# Patient Record
Sex: Female | Born: 1985 | Race: White | Hispanic: No | Marital: Single | Smoking: Current every day smoker
Health system: Southern US, Community
[De-identification: ages and names within clinical notes are randomized; demographics above are authoritative.]

---

## 2019-08-09 ENCOUNTER — Encounter: Payer: Self-pay | Admitting: Emergency Medicine

## 2019-08-09 ENCOUNTER — Other Ambulatory Visit: Payer: Self-pay

## 2019-08-09 DIAGNOSIS — K0401 Reversible pulpitis: Secondary | ICD-10-CM | POA: Insufficient documentation

## 2019-08-09 DIAGNOSIS — F17200 Nicotine dependence, unspecified, uncomplicated: Secondary | ICD-10-CM | POA: Insufficient documentation

## 2019-08-09 NOTE — ED Triage Notes (Addendum)
Patient ambulatory to triage with steady gait, without difficulty or distress noted; pt reports 8yrs ago filling fell out left lower tooth, past 4 days having increased pain; taking ibuprofen without relief (last ds 54min PTA)

## 2019-08-10 ENCOUNTER — Emergency Department
Admission: EM | Admit: 2019-08-10 | Discharge: 2019-08-10 | Disposition: A | Discharge status: Home / Self Care | Payer: Self-pay | Attending: Emergency Medicine | Admitting: Emergency Medicine

## 2019-08-10 DIAGNOSIS — K0401 Reversible pulpitis: Secondary | ICD-10-CM

## 2019-08-10 MED ORDER — PENICILLIN V POTASSIUM 500 MG PO TABS: 500.0000 mg | ORAL_TABLET | Freq: Two times a day (BID) | ORAL | 0 refills | Status: AC

## 2019-08-10 MED ORDER — TRAMADOL HCL 50 MG PO TABS: 50.0000 mg | ORAL_TABLET | Freq: Four times a day (QID) | ORAL | 0 refills | Status: AC | PRN

## 2019-08-10 NOTE — ED Provider Notes (Signed)
[facility] Emergency Department Provider Note  ____________________________________________   First MD Initiated Contact with Patient 08/10/19 0150     (approximate)  I have reviewed the triage vital signs and the nursing notes.   HISTORY  Chief Complaint Dental pain.    HPI Dawn Ware is a 33 y.o. female who presents with left lower tooth pain.  Patient had a filling fall out four years ago.  Patient's been having increased pain over the past 4 days.  She been taking ibuprofen without relief.  Patient has pain on the molar tooth that is severe, constant, 4 days, not better with ibuprofen, worse with cold food.  Denies any facial swelling.  No fevers.          History reviewed. No pertinent past medical history.  There are no active problems to display for this patient.   History reviewed. No pertinent surgical history.  Prior to Admission medications   Not on File    Allergies Patient has no known allergies.  No family history on file.  Social History Social History   Tobacco Use  . Smoking status: Current Every Day Smoker  . Smokeless tobacco: Never Used  Substance Use Topics  . Alcohol use: Not on file  . Drug use: Not on file      Review of Systems Constitutional: No fever/chills Eyes: No visual changes. ENT: No sore throat.  Positive tooth pain Cardiovascular: Denies chest pain. Respiratory: Denies shortness of breath. Gastrointestinal: No abdominal pain.  No nausea, no vomiting.  No diarrhea.  No constipation. Genitourinary: Negative for dysuria. Musculoskeletal: Negative for back pain. Skin: Negative for rash. Neurological: Negative for headaches, focal weakness or numbness. All other ROS negative ____________________________________________   PHYSICAL EXAM:  VITAL SIGNS: ED Triage Vitals  Enc Vitals Group     BP 08/09/19 2335 (!) 151/92     Pulse Rate 08/09/19 2335 83     Resp 08/09/19 2335 16     Temp  08/09/19 2335 98.5 F (36.9 C)     Temp Source 08/09/19 2335 Oral     SpO2 08/09/19 2335 100 %     Weight 08/09/19 2335 173 lb (78.5 kg)     Height 08/09/19 2335 5\' 4"  (1.626 m)     Head Circumference --      Peak Flow --      Pain Score 08/09/19 2339 10     Pain Loc --      Pain Edu? --      Excl. in [place]? --     Constitutional: Alert and oriented. Well appearing and in no acute distress. Eyes: Conjunctivae are normal. EOMI. Head: Atraumatic. Nose: No congestion/rhinnorhea. Mouth/Throat: Mucous membranes are moist.  Patient has weight feeling on the back left molar tooth with some tenderness on the tooth.  No fluctuation.  No swelling on the outside of the face. Neck: No stridor. Trachea Midline. FROM Cardiovascular: Normal rate, regular rhythm. Grossly normal heart sounds.  Good peripheral circulation. Respiratory: Normal respiratory effort.  No retractions. Lungs CTAB. Gastrointestinal: Soft and nontender. No distention. No abdominal bruits.  Musculoskeletal: No lower extremity tenderness nor edema.  No joint effusions. Neurologic:  Normal speech and language. No gross focal neurologic deficits are appreciated.  Skin:  Skin is warm, dry and intact. No rash noted. Psychiatric: Mood and affect are normal. Speech and behavior are normal. GU: Deferred   _INITIAL IMPRESSION / [place] / ED COURSE  Dawn Ware was evaluated in  Emergency Department on 08/10/2019 for the symptoms described in the history of present illness. She was evaluated in the context of the global COVID-19 pandemic, which necessitated consideration that the patient might be at risk for infection with the SARS-CoV-2 virus that causes COVID-19. Institutional protocols and algorithms that pertain to the evaluation of patients at risk for COVID-19 are in a state of rapid change based on information released by regulatory bodies including the CDC and federal and state organizations. These policies and  algorithms were followed during the patient's care in the ED.    This is most consistent with pulpitis.  No evidence of abscess, trench mouth, retropharyngeal abscess.  Will give patient amoxicillin, recommended Tylenol and ibuprofen for pain and using tramadol for breakthrough pain I will give a list of dentist.  ____________________________________________   FINAL CLINICAL IMPRESSION(S) / ED DIAGNOSES   Final diagnoses:  Pulpitis      MEDICATIONS GIVEN DURING THIS VISIT:  Medications - No data to display   ED Discharge Orders         Ordered    penicillin v potassium (VEETID) 500 MG tablet  2 times daily     08/10/19 0212    traMADol (ULTRAM) 50 MG tablet  Every 6 hours PRN     08/10/19 0212           Note:  This document was prepared using Dragon voice recognition software and may include unintentional dictation errors.   Concha Se, MD 08/10/19 [phone]

## 2019-08-10 NOTE — Discharge Instructions (Addendum)
Take ibuprofen 60 3 times daily.  Tylenol 1 g 3 times daily.  Use tramadol for breakthrough pain.  Do not drive or work while on this.  Follow-up with dentist.  OPTIONS FOR DENTAL FOLLOW UP CARE  [place] [facility] Health and [facility] Services - Local Safety Net Dental Clinics [email or web address]   [facility] [phone])  [facility] [phone])  [place] [phone])  [facility] [phone])  [facility] [phone]) This clinic caters to the indigent population and is on a lottery system. Location: [employer] [facility], [employer], [address] [place] Clinic Hours: Wednesdays from 6pm - 9pm, patients seen by a lottery system. For dates, call or go to [email or web address] Services: Cleanings, fillings and simple extractions. Payment Options: DENTAL WORK IS FREE OF CHARGE. Bring proof of income or support. Best way to get seen: Arrive at 5:15 pm - this is a lottery, NOT first come/first serve, so arriving earlier will not increase your chances of being seen.     [facility] [phone] Select option 1 for emergencies   Location: [facility], [place], [address], [place] Clinic Hours: No walk-ins accepted - call the day before to schedule an appointment. Check in times are 9:30 am and 1:30 pm. Services: Simple extractions, temporary fillings, pulpectomy/pulp debridement, uncomplicated abscess drainage. Payment Options: PAYMENT IS DUE AT THE TIME OF SERVICE.  Fee is usually $100-200, additional surgical procedures (e.g. abscess drainage) may be extra. Cash, checks, Visa/MasterCard accepted.  Can file Medicaid if patient is covered for dental - patient should call case worker to check. No discount for [facility] patients. Best way to get seen: MUST call  the day before and get onto the schedule. Can usually be seen the next 1-2 days. No walk-ins accepted.     [facility] [phone]   Location: [facility], [address], [place] Clinic Hours: M, W, Th, F 8am or 1:30pm, Tues 9a or 1:30 - first come/first served. Services: Simple extractions, temporary fillings, uncomplicated abscess drainage.  You do not need to be an [facility] resident. Payment Options: PAYMENT IS DUE AT THE TIME OF SERVICE. Dental insurance, otherwise sliding scale - bring proof of income or support. Depending on income and treatment needed, cost is usually $50-200. Best way to get seen: Arrive early as it is first come/first served.     [facility] [phone]   Location: [address] Clinic Hours: Mon-Thu 8a-5p Services: Most basic dental services including extractions and fillings. Payment Options: PAYMENT IS DUE AT THE TIME OF SERVICE. Sliding scale, up to 50% off - bring proof if income or support. Medicaid with dental option accepted. Best way to get seen: Call to schedule an appointment, can usually be seen within 2 weeks OR they will try to see walk-ins - show up at 8a or 2p (you may have to wait).     [facility] [phone] [place] RESIDENTS ONLY   Location: [facility], [address] [place], [place] [address] Clinic Hours: By appointment only. Monday - Thursday 8am-5pm, Friday 8am-12pm Services: Cleanings, fillings, extractions. Payment Options: PAYMENT IS DUE AT THE TIME OF SERVICE. Cash, Visa or MasterCard. Sliding scale - $30 minimum per service. Best way to get seen: Come in to office, complete packet and make an appointment - need proof of income or support monies for  each household member and proof of [facility] residence. Usually takes about a month to get in.     [facility] [phone]   Location: [address], [place] Clinic Hours: Walk-in Urgent Care Dental Services are offered Monday-Friday mornings only. The numbers of emergencies accepted daily is limited to the number of providers available. Maximum 15 - Mondays, Wednesdays & Thursdays Maximum 10 - Tuesdays & Fridays Services: You do not need to be a [facility] resident to be seen for a dental emergency. Emergencies are defined as pain, swelling, abnormal bleeding, or dental trauma. Walkins will receive x-rays if needed. NOTE: Dental cleaning is not an emergency. Payment Options: PAYMENT IS DUE AT THE TIME OF SERVICE. Minimum co-pay is $40.00 for uninsured patients. Minimum co-pay is $3.00 for Medicaid with dental coverage. Dental Insurance is accepted and must be presented at time of visit. Medicare does not cover dental. Forms of payment: Cash, credit card, checks. Best way to get seen: If not previously registered with the clinic, walk-in dental registration begins at 7:15 am and is on a first come/first serve basis. If previously registered with the clinic, call to make an appointment.     [facility] [place] ONLY   Location: [address], [place], [place] Clinic Hours: Mon-Thu 10a-2p Services: Extractions only! Payment Options: FREE (donations accepted) - bring proof of income or support Best way to get seen: Call and schedule an appointment OR come at 8am on the 1st Monday of every month (except for holidays) when it is first come/first served.     [facility] [phone]   Location: [place], [place] Clinic Hours: Friday mornings Services, Payment Options, Best way to get seen: Call for info

## 2020-06-25 ENCOUNTER — Emergency Department
Admission: EM | Admit: 2020-06-25 | Discharge: 2020-06-25 | Disposition: A | Discharge status: Home / Self Care | Payer: HRSA Program | Attending: Emergency Medicine | Admitting: Emergency Medicine

## 2020-06-25 ENCOUNTER — Emergency Department: Payer: HRSA Program

## 2020-06-25 ENCOUNTER — Other Ambulatory Visit: Payer: Self-pay

## 2020-06-25 DIAGNOSIS — R079 Chest pain, unspecified: Secondary | ICD-10-CM | POA: Diagnosis present

## 2020-06-25 DIAGNOSIS — U071 COVID-19: Secondary | ICD-10-CM | POA: Diagnosis not present

## 2020-06-25 DIAGNOSIS — F17200 Nicotine dependence, unspecified, uncomplicated: Secondary | ICD-10-CM | POA: Diagnosis not present

## 2020-06-25 DIAGNOSIS — R0789 Other chest pain: Secondary | ICD-10-CM

## 2020-06-25 DIAGNOSIS — R0602 Shortness of breath: Secondary | ICD-10-CM

## 2020-06-25 DIAGNOSIS — R059 Cough, unspecified: Secondary | ICD-10-CM

## 2020-06-25 LAB — COMPREHENSIVE METABOLIC PANEL
ALT: 55 U/L — ABNORMAL HIGH (ref 0–44)
AST: 63 U/L — ABNORMAL HIGH (ref 15–41)
Albumin: 4.3 g/dL (ref 3.5–5.0)
Alkaline Phosphatase: 47 U/L (ref 38–126)
Anion gap: 6 (ref 5–15)
BUN: 8 mg/dL (ref 6–20)
CO2: 29 mmol/L (ref 22–32)
Calcium: 8.7 mg/dL — ABNORMAL LOW (ref 8.9–10.3)
Chloride: 102 mmol/L (ref 98–111)
Creatinine, Ser: 0.64 mg/dL (ref 0.44–1.00)
GFR calc Af Amer: >60 mL/min (ref >60)
GFR calc non Af Amer: >60 mL/min (ref >60)
Glucose, Bld: 93 mg/dL (ref 70–99)
Potassium: 3.6 mmol/L (ref 3.5–5.1)
Sodium: 137 mmol/L (ref 135–145)
Total Bilirubin: 0.4 mg/dL (ref 0.3–1.2)
Total Protein: 7.8 g/dL (ref 6.5–8.1)

## 2020-06-25 LAB — TROPONIN I (HIGH SENSITIVITY)
Troponin I (High Sensitivity): 5 ng/L (ref <18)
Troponin I (High Sensitivity): 4 ng/L (ref <18)

## 2020-06-25 LAB — CBC
HCT: 39.7 % (ref 36.0–46.0)
Hemoglobin: 13.1 g/dL (ref 12.0–15.0)
MCH: 30.7 pg (ref 26.0–34.0)
MCHC: 33 g/dL (ref 30.0–36.0)
MCV: 93 fL (ref 80.0–100.0)
Platelets: 102 10*3/uL — ABNORMAL LOW (ref 150–400)
RBC: 4.27 MIL/uL (ref 3.87–5.11)
RDW: 12.6 % (ref 11.5–15.5)
WBC: 4.5 10*3/uL (ref 4.0–10.5)
nRBC: 0 % (ref 0.0–0.2)

## 2020-06-25 LAB — LACTIC ACID, PLASMA: Lactic Acid, Venous: 0.8 mmol/L (ref 0.5–1.9)

## 2020-06-25 MED ORDER — ACETAMINOPHEN 500 MG PO TABS
1000.0000 mg | ORAL_TABLET | Freq: Once | ORAL | Status: AC
  Administered 2020-06-25: 1000 mg via ORAL
  Filled 2020-06-25: qty 2

## 2020-06-25 MED ORDER — KETOROLAC TROMETHAMINE 30 MG/ML IJ SOLN
30.0000 mg | Freq: Once | INTRAMUSCULAR | Status: AC
  Administered 2020-06-25: 30 mg via INTRAMUSCULAR
  Filled 2020-06-25: qty 1

## 2020-06-25 NOTE — Discharge Instructions (Addendum)
Please take Tylenol and ibuprofen/Advil for your pain.  It is safe to take them together, or to alternate them every few hours.  Take up to 1000mg  of Tylenol at a time, up to 4 times per day.  Do not take more than 4000 mg of Tylenol in 24 hours.  For ibuprofen, take 400-600 mg, 4-5 times per day.  You may continue to use Mucinex as well in addition to the above medications.  There is no evidence of strain or damage to your heart.  No evidence of a bacterial pneumonia or illness that requires antibiotics.  If you develop any worsening symptoms despite the above medications, please return to the ED.

## 2020-06-25 NOTE — ED Provider Notes (Signed)
[facility] Emergency Department Provider Note ____________________________________________   None    (approximate)  I have reviewed the triage vital signs and the nursing notes.  HISTORY  Chief Complaint Chest Pain (Thinks she has pneumonia)   HPI Dawn Ware is a 34 y.o. femalewho presents to the ED for evaluation of chest pain.   Chart review indicates no relevant medical history within her system. Patient is a non-smoker with no cardiac history.  No family history of early cardiac disease.  No history of diabetes.  Patient reports having symptoms for 6 days and testing positive for Covid 6 days ago.  She reports fevers and chills, transiently resolved with oral ibuprofen.  Shortness of breath, nonproductive cough and substernal chest pain.  She reports her chest pain also improved with ibuprofen ministration.  It is up to 6/10 intensity, aching in nature, substernal and constant when it is present.  He reports waking up in the middle the night last night and feeling short of breath, "getting into a coughing fit" and developing fear that there was something wrong with her heart.  She therefore presents to the ED for evaluation of her chest pain.  History reviewed. No pertinent past medical history.  There are no problems to display for this patient.   History reviewed. No pertinent surgical history.  Prior to Admission medications   Not on File    Allergies Patient has no known allergies.  No family history on file.  Social History Social History   Tobacco Use  . Smoking status: Current Every Day Smoker  . Smokeless tobacco: Never Used  Vaping Use  . Vaping Use: Every day  Substance Use Topics  . Alcohol use: Not Currently  . Drug use: Not Currently    Review of Systems  Constitutional: Positive for fevers and chills. Eyes: No visual changes. ENT: No sore throat. Cardiovascular: Positive chest pain Respiratory: Positive  shortness of breath and nonproductive cough. Gastrointestinal: No abdominal pain.  No nausea, no vomiting.  No diarrhea.  No constipation. Genitourinary: Negative for dysuria. Musculoskeletal: Negative for back pain. Skin: Negative for rash. Neurological: Negative for headaches, focal weakness or numbness.   ____________________________________________   PHYSICAL EXAM:  VITAL SIGNS: Vitals:   06/25/20 0644 06/25/20 0913  BP: 126/77 135/82  Pulse: 89 99  Resp: 18 20  Temp: 99.2 F (37.3 C) 98.8 F (37.1 C)  SpO2: 98% 98%      Constitutional: Alert and oriented.  Uncomfortable-appearing and in no acute distress. Eyes: Conjunctivae are normal. PERRL. EOMI. Head: Atraumatic. Nose: No congestion/rhinnorhea. Mouth/Throat: Mucous membranes are moist.  Oropharynx non-erythematous. Neck: No stridor. No cervical spine tenderness to palpation. Cardiovascular: Normal rate, regular rhythm. Grossly normal heart sounds.  Good peripheral circulation. Respiratory: Normal respiratory effort.  No retractions. Lungs CTAB. Gastrointestinal: Soft , nondistended, nontender to palpation. No abdominal bruits. No CVA tenderness. Musculoskeletal: No lower extremity tenderness nor edema.  No joint effusions. No signs of acute trauma. Neurologic:  Normal speech and language. No gross focal neurologic deficits are appreciated. No gait instability noted. Skin:  Skin is warm, dry and intact. No rash noted. Psychiatric: Mood and affect are normal. Speech and behavior are normal.  ____________________________________________   LABS (all labs ordered are listed, but only abnormal results are displayed)  Labs Reviewed  CBC - Abnormal; Notable for the following components:      Result Value   Platelets 102 (*)    All other components within normal limits  COMPREHENSIVE  METABOLIC PANEL - Abnormal; Notable for the following components:   Calcium 8.7 (*)    AST 63 (*)    ALT 55 (*)    All other  components within normal limits  LACTIC ACID, PLASMA  LACTIC ACID, PLASMA  TROPONIN I (HIGH SENSITIVITY)  TROPONIN I (HIGH SENSITIVITY)   ____________________________________________  12 Lead EKG  Sinus rhythm, rate of 84 bpm, normal axis and intervals.  No evidence of acute ischemia. ____________________________________________  RADIOLOGY  ED MD interpretation: Two view CXR with evidence of bilateral patchy infiltrates consistent with no diagnosis of COVID-19, without evidence of discrete lobar patrician to suggest a superimposed bacterial pneumonia.  Official radiology report(s): DG Chest 2 View  Result Date: 06/25/2020 CLINICAL DATA:  COVID positive EXAM: CHEST - 2 VIEW COMPARISON:  None. FINDINGS: Patchy bilateral airspace disease, mild to moderate in extent. No edema, effusion, or pneumothorax. Normal heart size and mediastinal contours. IMPRESSION: Bilateral atypical pneumonia. Electronically Signed   By: Marnee Spring M.D.   On: 06/25/2020 05:16    ____________________________________________   PROCEDURES and INTERVENTIONS  Procedure(s) performed (including Critical Care):  Procedures  Medications  acetaminophen (TYLENOL) tablet 1,000 mg (1,000 mg Oral Given 06/25/20 1047)  ketorolac (TORADOL) 30 MG/ML injection 30 mg (30 mg Intramuscular Given 06/25/20 1048)    ____________________________________________   MDM / ED COURSE  Otherwise healthy and low risk 34 year old female presenting with chest pain and shortness of breath in the setting of known COVID-19 positivity, without evidence of cardiac ischemia or additional acute pathologies, amenable to outpatient management.  Patient febrile in triage, otherwise normal vital signs on room air.  Exam with an uncomfortable-appearing patient, who has no distress, signs of trauma or neurovascular deficits.  Her abdomen is benign and after Toradol/Tylenol ministration, she looks quite well.  She is not hypoxic with ambulation  trial, per RN documentation.  CXR demonstrates expected opacities in the setting of known COVID-19 diagnosis.  Nonischemic EKG and negative troponin, blood work otherwise without acute derangements.  No evidence of acute superimposed bacterial pneumonia to require antibiotics.  Educated patient on outpatient management of Covid, and we discussed return precautions for the ED.  Patient medically stable for discharge home.  ____________________________________________   FINAL CLINICAL IMPRESSION(S) / ED DIAGNOSES  Final diagnoses:  Other chest pain  COVID-19  Shortness of breath  Cough     ED Discharge Orders    None       Dawn Ware   Note:  This document was prepared using Dragon voice recognition software and may include unintentional dictation errors.   Delton [place], MD 06/25/20 1130

## 2020-06-25 NOTE — ED Triage Notes (Signed)
Chest pain with inspiration. Takes a deep breath and starts to cough. Keeps coughing. Has Covid x 6 days. Here because she feels like she is "about to die."

## 2020-06-25 NOTE — ED Notes (Signed)
Pulse ox walking showed 93-94%, md aware

## 2020-06-25 NOTE — ED Triage Notes (Signed)
Patient has taken ibuprofen for fever at home at 0230.

## 2020-06-29 ENCOUNTER — Inpatient Hospital Stay: Payer: HRSA Program

## 2020-06-29 ENCOUNTER — Encounter: Payer: Self-pay | Admitting: Internal Medicine

## 2020-06-29 ENCOUNTER — Inpatient Hospital Stay
Admission: EM | Admit: 2020-06-29 | Discharge: 2020-07-04 | DRG: 177 | Disposition: A | Discharge status: Home / Self Care | Payer: HRSA Program | Source: Ambulatory Visit | Attending: Internal Medicine | Admitting: Internal Medicine

## 2020-06-29 ENCOUNTER — Other Ambulatory Visit: Payer: Self-pay

## 2020-06-29 ENCOUNTER — Ambulatory Visit
Admission: EM | Admit: 2020-06-29 | Discharge: 2020-06-29 | Disposition: A | Discharge status: Home / Self Care | Payer: Medicaid Other | Attending: Family Medicine | Admitting: Family Medicine

## 2020-06-29 ENCOUNTER — Emergency Department: Payer: HRSA Program

## 2020-06-29 ENCOUNTER — Encounter: Payer: Self-pay | Admitting: Emergency Medicine

## 2020-06-29 DIAGNOSIS — R05 Cough: Secondary | ICD-10-CM

## 2020-06-29 DIAGNOSIS — U071 COVID-19: Secondary | ICD-10-CM

## 2020-06-29 DIAGNOSIS — F17290 Nicotine dependence, other tobacco product, uncomplicated: Secondary | ICD-10-CM | POA: Diagnosis present

## 2020-06-29 DIAGNOSIS — Z8249 Family history of ischemic heart disease and other diseases of the circulatory system: Secondary | ICD-10-CM

## 2020-06-29 DIAGNOSIS — R7401 Elevation of levels of liver transaminase levels: Secondary | ICD-10-CM | POA: Diagnosis present

## 2020-06-29 DIAGNOSIS — J1282 Pneumonia due to coronavirus disease 2019: Secondary | ICD-10-CM | POA: Diagnosis present

## 2020-06-29 DIAGNOSIS — R0603 Acute respiratory distress: Secondary | ICD-10-CM | POA: Diagnosis not present

## 2020-06-29 DIAGNOSIS — R5383 Other fatigue: Secondary | ICD-10-CM

## 2020-06-29 DIAGNOSIS — R0902 Hypoxemia: Secondary | ICD-10-CM | POA: Diagnosis not present

## 2020-06-29 DIAGNOSIS — R0602 Shortness of breath: Secondary | ICD-10-CM | POA: Diagnosis not present

## 2020-06-29 DIAGNOSIS — J9600 Acute respiratory failure, unspecified whether with hypoxia or hypercapnia: Secondary | ICD-10-CM | POA: Diagnosis not present

## 2020-06-29 DIAGNOSIS — J9601 Acute respiratory failure with hypoxia: Secondary | ICD-10-CM | POA: Diagnosis present

## 2020-06-29 DIAGNOSIS — J9621 Acute and chronic respiratory failure with hypoxia: Secondary | ICD-10-CM | POA: Diagnosis present

## 2020-06-29 DIAGNOSIS — R531 Weakness: Secondary | ICD-10-CM

## 2020-06-29 DIAGNOSIS — R079 Chest pain, unspecified: Secondary | ICD-10-CM

## 2020-06-29 DIAGNOSIS — R519 Headache, unspecified: Secondary | ICD-10-CM

## 2020-06-29 DIAGNOSIS — R059 Cough, unspecified: Secondary | ICD-10-CM

## 2020-06-29 LAB — CBC WITH DIFFERENTIAL/PLATELET
Abs Immature Granulocytes: 0.04 10*3/uL (ref 0.00–0.07)
Basophils Absolute: 0 10*3/uL (ref 0.0–0.1)
Basophils Relative: 0 %
Eosinophils Absolute: 0 10*3/uL (ref 0.0–0.5)
Eosinophils Relative: 0 %
HCT: 41.2 % (ref 36.0–46.0)
Hemoglobin: 13.8 g/dL (ref 12.0–15.0)
Immature Granulocytes: 1 %
Lymphocytes Relative: 16 %
Lymphs Abs: 0.9 10*3/uL (ref 0.7–4.0)
MCH: 30.5 pg (ref 26.0–34.0)
MCHC: 33.5 g/dL (ref 30.0–36.0)
MCV: 90.9 fL (ref 80.0–100.0)
Monocytes Absolute: 0.6 10*3/uL (ref 0.1–1.0)
Monocytes Relative: 11 %
Neutro Abs: 4.2 10*3/uL (ref 1.7–7.7)
Neutrophils Relative %: 72 %
Platelets: 158 10*3/uL (ref 150–400)
RBC: 4.53 MIL/uL (ref 3.87–5.11)
RDW: 12.5 % (ref 11.5–15.5)
WBC: 5.8 10*3/uL (ref 4.0–10.5)
nRBC: 0 % (ref 0.0–0.2)

## 2020-06-29 LAB — COMPREHENSIVE METABOLIC PANEL
ALT: 102 U/L — ABNORMAL HIGH (ref 0–44)
AST: 140 U/L — ABNORMAL HIGH (ref 15–41)
Albumin: 3.8 g/dL (ref 3.5–5.0)
Alkaline Phosphatase: 45 U/L (ref 38–126)
Anion gap: 12 (ref 5–15)
BUN: 7 mg/dL (ref 6–20)
CO2: 25 mmol/L (ref 22–32)
Calcium: 8.6 mg/dL — ABNORMAL LOW (ref 8.9–10.3)
Chloride: 100 mmol/L (ref 98–111)
Creatinine, Ser: 0.7 mg/dL (ref 0.44–1.00)
GFR calc Af Amer: >60 mL/min (ref >60)
GFR calc non Af Amer: >60 mL/min (ref >60)
Glucose, Bld: 102 mg/dL — ABNORMAL HIGH (ref 70–99)
Potassium: 4 mmol/L (ref 3.5–5.1)
Sodium: 137 mmol/L (ref 135–145)
Total Bilirubin: 1 mg/dL (ref 0.3–1.2)
Total Protein: 7.3 g/dL (ref 6.5–8.1)

## 2020-06-29 LAB — BLOOD GAS, VENOUS
Acid-Base Excess: 7.6 mmol/L — ABNORMAL HIGH (ref 0.0–2.0)
Bicarbonate: 32.7 mmol/L — ABNORMAL HIGH (ref 20.0–28.0)
O2 Saturation: 17.3 %
Patient temperature: 37
pCO2, Ven: 46 mmHg (ref 44.0–60.0)
pH, Ven: 7.46 — ABNORMAL HIGH (ref 7.250–7.430)

## 2020-06-29 LAB — TROPONIN I (HIGH SENSITIVITY)
Troponin I (High Sensitivity): 5 ng/L (ref <18)
Troponin I (High Sensitivity): 7 ng/L (ref <18)

## 2020-06-29 LAB — POCT PREGNANCY, URINE: Preg Test, Ur: NEGATIVE

## 2020-06-29 LAB — HIV ANTIBODY (ROUTINE TESTING W REFLEX): HIV Screen 4th Generation wRfx: NONREACTIVE

## 2020-06-29 LAB — BRAIN NATRIURETIC PEPTIDE: B Natriuretic Peptide: 30.7 pg/mL (ref 0.0–100.0)

## 2020-06-29 LAB — ABO/RH: ABO/RH(D): A POS

## 2020-06-29 LAB — MAGNESIUM: Magnesium: 2 mg/dL (ref 1.7–2.4)

## 2020-06-29 LAB — FIBRIN DERIVATIVES D-DIMER (ARMC ONLY): Fibrin derivatives D-dimer (ARMC): 982.42 ng/mL (FEU) — ABNORMAL HIGH (ref 0.00–499.00)

## 2020-06-29 IMAGING — CT CT ANGIO CHEST
2 of 6 series · 19 of 46 positions shown · IV contrast (APPLIED)
Comparison: None.

CLINICAL DATA: Chest pain, COVID positive

EXAM:
CT ANGIOGRAPHY CHEST WITH CONTRAST
TECHNIQUE: Multidetector CT imaging of the chest was performed using the
standard protocol during bolus administration of intravenous
contrast. Multiplanar CT image reconstructions and MIPs were
obtained to evaluate the vascular anatomy.
CONTRAST:  100mL OMNIPAQUE IOHEXOL 350 MG/ML SOLN

[Series 5: thins · axial · 0.64mm/px · z∈[+558,+800]mm · 17 of 267 slices shown]
[im 12/267  lung]
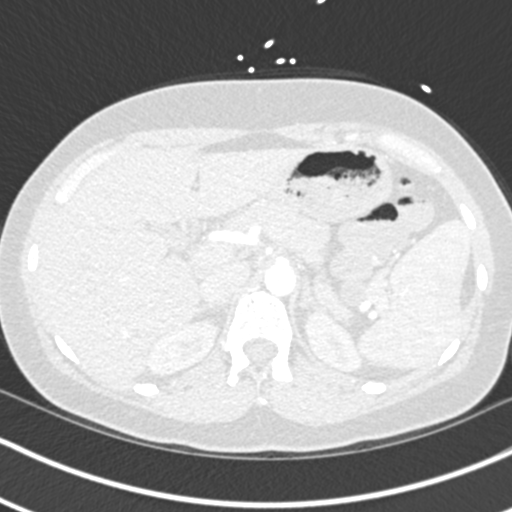
[im 24/267  soft-tissue]
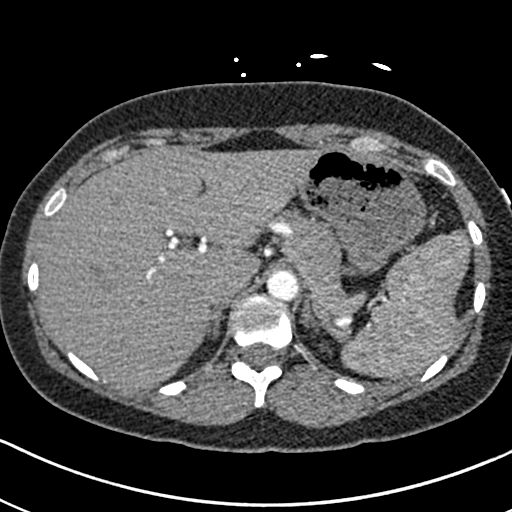
[im 47/267  lung]
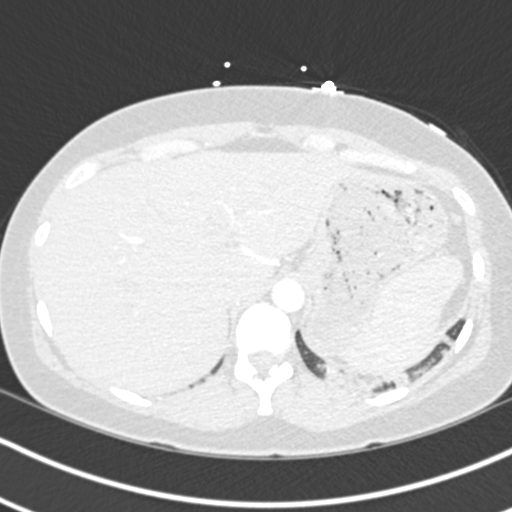
[im 58/267  soft-tissue]
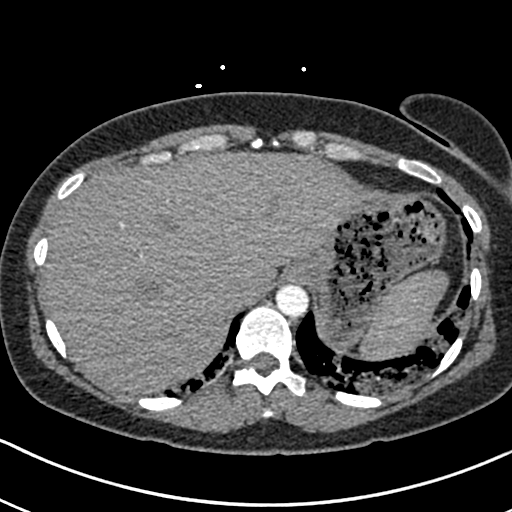
[im 70/267  lung]
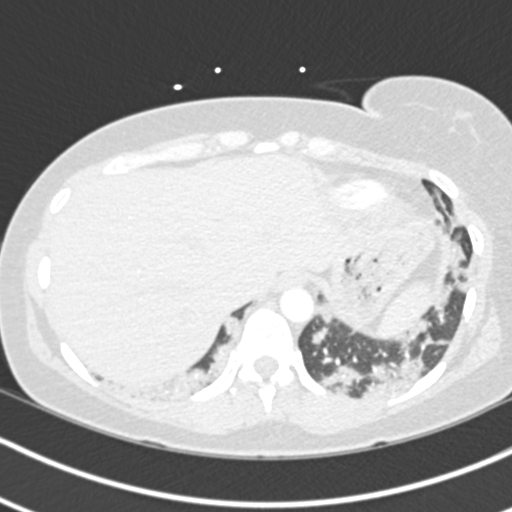
[im 93/267  soft-tissue]
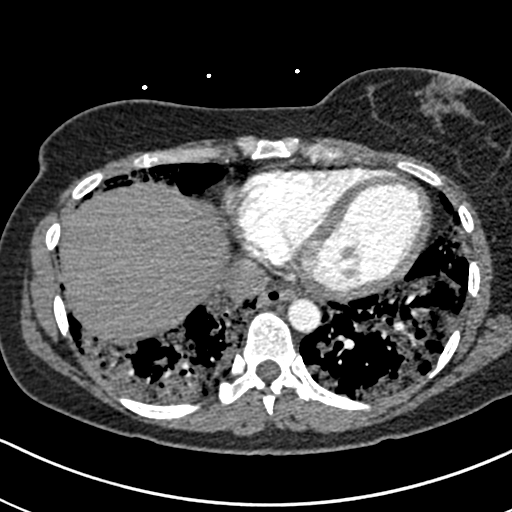
[im 105/267  lung]
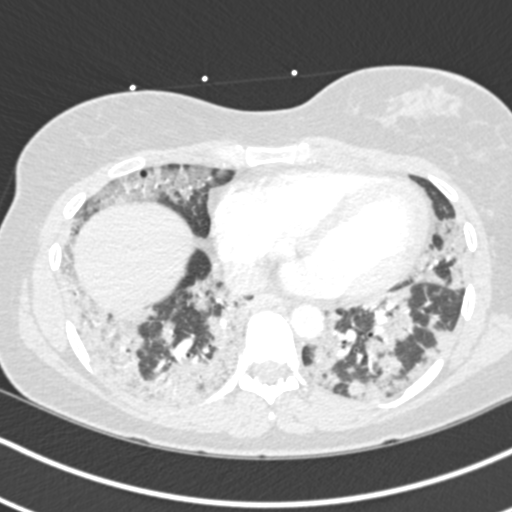
[im 116/267  soft-tissue]
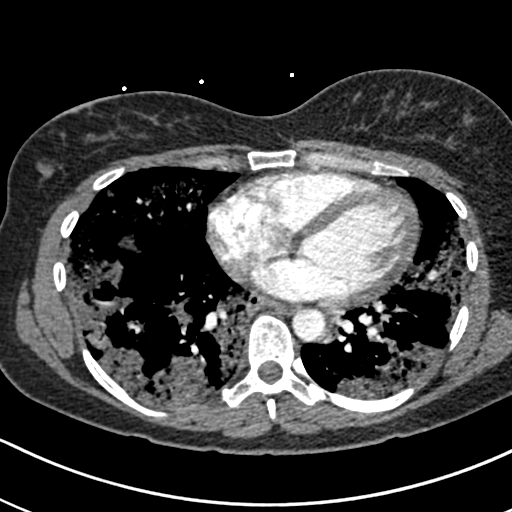
[im 139/267  lung]
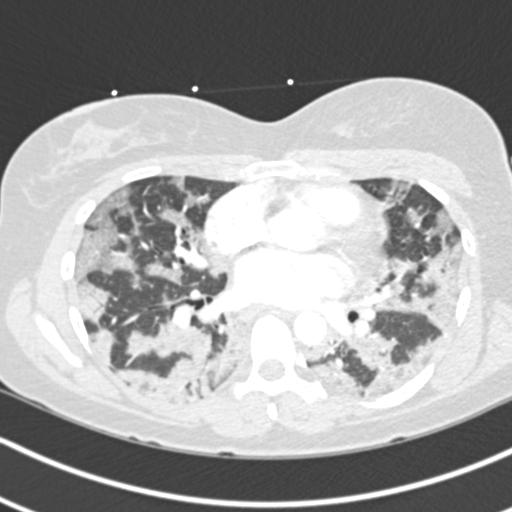
[im 151/267  soft-tissue]
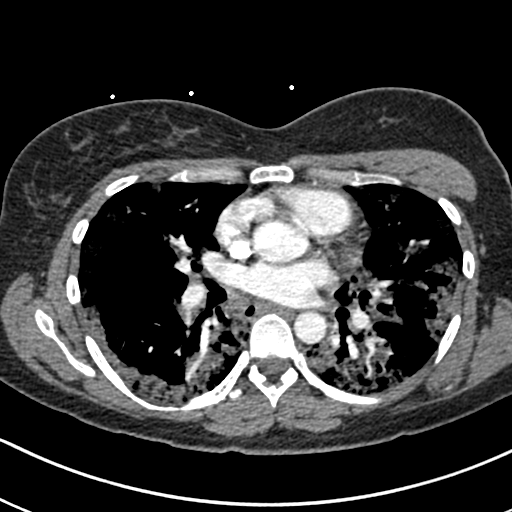
[im 162/267  lung]
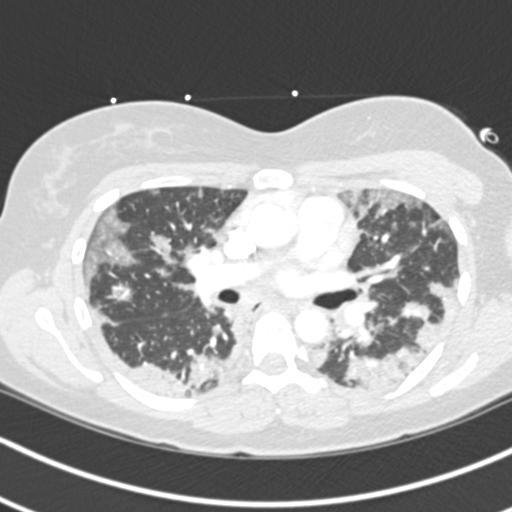
[im 174/267  soft-tissue]
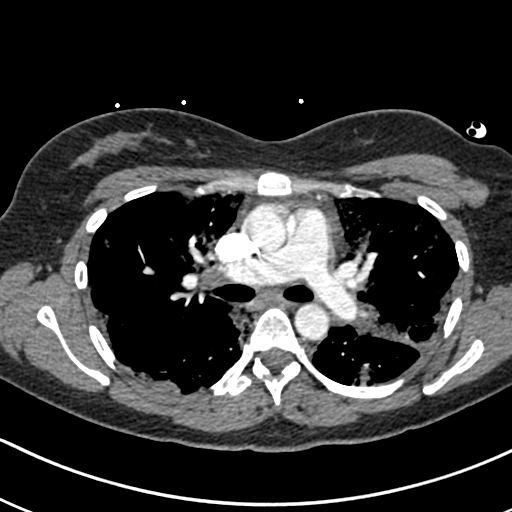
[im 197/267  lung]
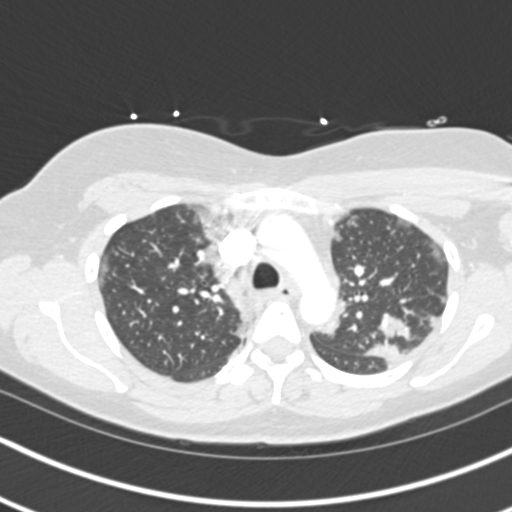
[im 209/267  soft-tissue]
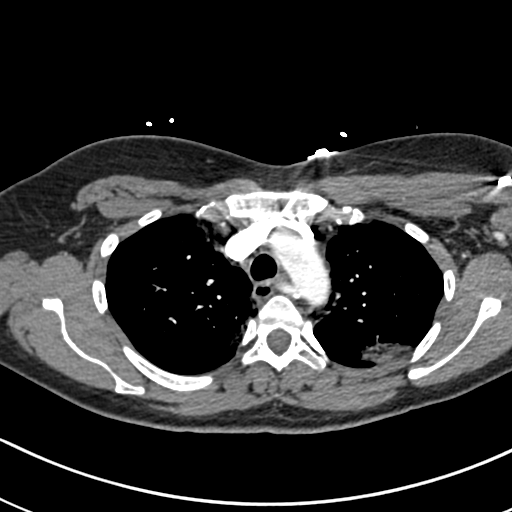
[im 220/267  lung]
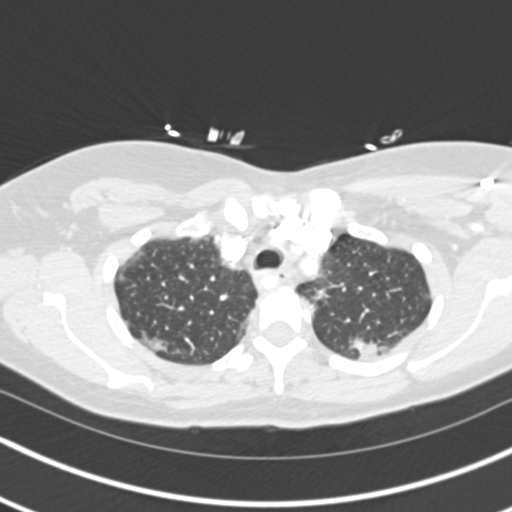
[im 243/267  soft-tissue]
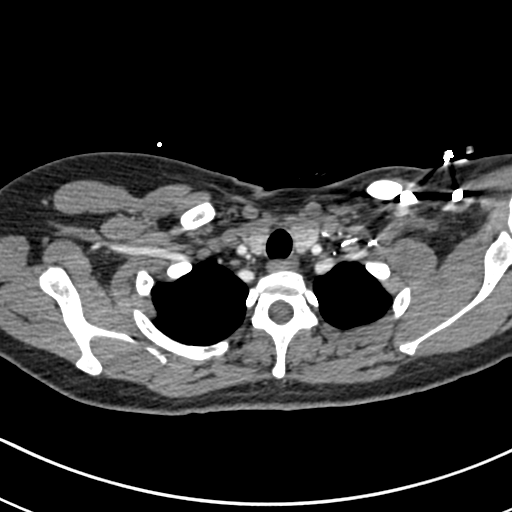
[im 255/267  lung]
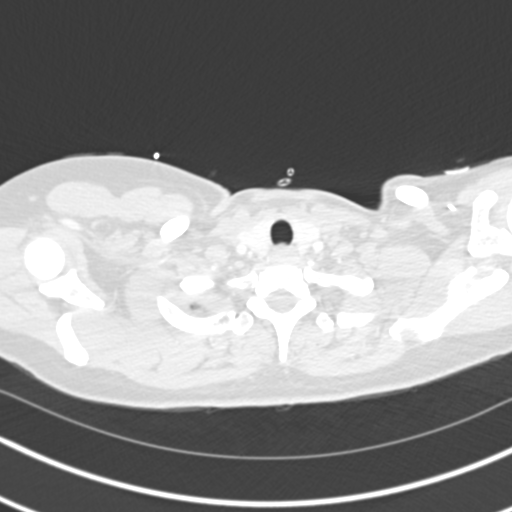

[Series 7: coronal mpr · coronal · 0.54mm/px · 2 of 78 slices shown]
[im 26/78  soft-tissue]
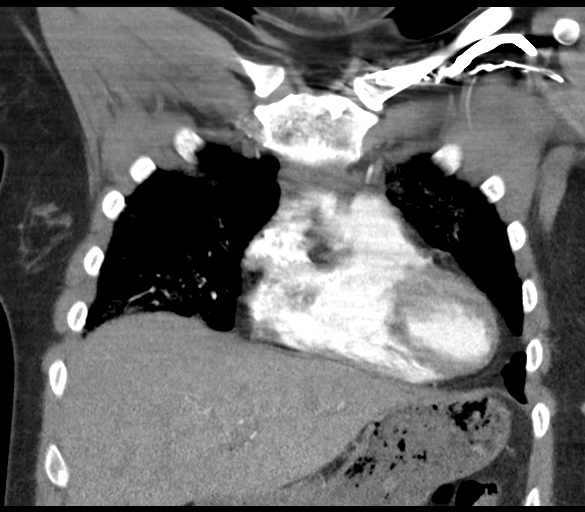
[im 52/78  soft-tissue]
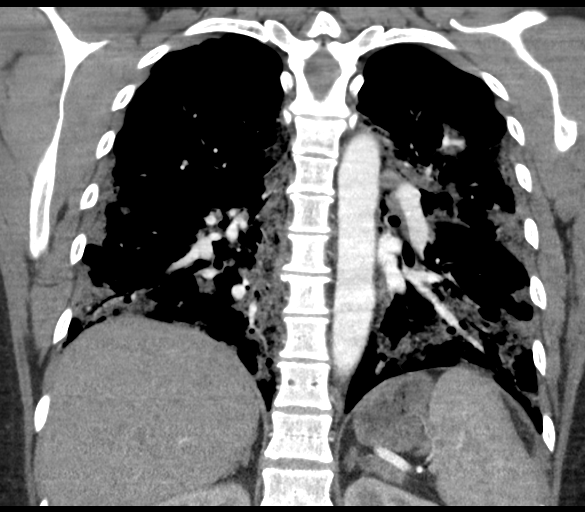

[19 of 46 positions shown; findings below may reference images not displayed]

FINDINGS: Cardiovascular: Satisfactory opacification of the pulmonary arteries
to the segmental level. No evidence of pulmonary embolism. Normal
heart size. No pericardial effusion. Thoracic aorta is normal in
caliber. Incidental note is made of an aberrant right subclavian
artery.

Mediastinum/Nodes: Small, likely reactive lymph nodes. Visualized
thyroid is unremarkable.

Lungs/Pleura: Diffuse bilateral consolidative and ground-glass
opacities with relative apical sparing. No pleural effusion. No
pneumothorax.

Upper Abdomen: No acute abnormality.

Musculoskeletal: No acute osseous abnormality.

Review of the MIP images confirms the above findings.
IMPRESSION: No evidence of acute pulmonary embolism.

Diffuse bilateral consolidative and ground-glass opacities likely
reflecting [CW] pneumonia.

## 2020-06-29 MED ORDER — PREDNISONE 50 MG PO TABS: 50.0000 mg | ORAL_TABLET | Freq: Every day | ORAL | Status: DC

## 2020-06-29 MED ORDER — SODIUM CHLORIDE 0.9% FLUSH
3.0000 mL | Freq: Two times a day (BID) | INTRAVENOUS | Status: DC
  Administered 2020-06-29 – 2020-07-04 (×11): 3 mL via INTRAVENOUS

## 2020-06-29 MED ORDER — ALBUTEROL SULFATE HFA 108 (90 BASE) MCG/ACT IN AERS
2.0000 | INHALATION_SPRAY | Freq: Four times a day (QID) | RESPIRATORY_TRACT | Status: DC
  Administered 2020-06-29 – 2020-06-30 (×5): 2 via RESPIRATORY_TRACT
  Filled 2020-06-29: qty 6.7

## 2020-06-29 MED ORDER — ONDANSETRON HCL 4 MG PO TABS: 4.0000 mg | ORAL_TABLET | Freq: Four times a day (QID) | ORAL | Status: DC | PRN

## 2020-06-29 MED ORDER — IOHEXOL 350 MG/ML SOLN
100.0000 mL | Freq: Once | INTRAVENOUS | Status: AC | PRN
  Administered 2020-06-29: 100 mL via INTRAVENOUS

## 2020-06-29 MED ORDER — LACTATED RINGERS IV BOLUS
1000.0000 mL | Freq: Once | INTRAVENOUS | Status: AC
  Administered 2020-06-29: 1000 mL via INTRAVENOUS

## 2020-06-29 MED ORDER — SODIUM CHLORIDE 0.9 % IV SOLN
200.0000 mg | Freq: Once | INTRAVENOUS | Status: AC
  Administered 2020-06-29: 200 mg via INTRAVENOUS
  Filled 2020-06-29: qty 200

## 2020-06-29 MED ORDER — ZINC SULFATE 220 (50 ZN) MG PO CAPS
220.0000 mg | ORAL_CAPSULE | Freq: Every day | ORAL | Status: DC
  Administered 2020-06-29 – 2020-07-04 (×6): 220 mg via ORAL
  Filled 2020-06-29 (×6): qty 1

## 2020-06-29 MED ORDER — ONDANSETRON HCL 4 MG/2ML IJ SOLN
4.0000 mg | Freq: Once | INTRAMUSCULAR | Status: AC
  Administered 2020-06-29: 4 mg via INTRAVENOUS
  Filled 2020-06-29: qty 2

## 2020-06-29 MED ORDER — SODIUM CHLORIDE 0.9 % IV SOLN
250.0000 mL | INTRAVENOUS | Status: DC | PRN
  Administered 2020-06-30: 10:00:00 250 mL via INTRAVENOUS

## 2020-06-29 MED ORDER — GUAIFENESIN-DM 100-10 MG/5ML PO SYRP
10.0000 mL | ORAL_SOLUTION | ORAL | Status: DC | PRN
  Administered 2020-06-30 – 2020-07-03 (×11): 10 mL via ORAL
  Filled 2020-06-29 (×12): qty 10

## 2020-06-29 MED ORDER — ENOXAPARIN SODIUM 40 MG/0.4ML ~~LOC~~ SOLN
40.0000 mg | SUBCUTANEOUS | Status: DC
  Administered 2020-06-29: 40 mg via SUBCUTANEOUS
  Filled 2020-06-29: qty 0.4

## 2020-06-29 MED ORDER — METHYLPREDNISOLONE SODIUM SUCC 125 MG IJ SOLR
125.0000 mg | Freq: Once | INTRAMUSCULAR | Status: AC
  Administered 2020-06-29: 125 mg via INTRAMUSCULAR

## 2020-06-29 MED ORDER — ONDANSETRON HCL 4 MG/2ML IJ SOLN: 4.0000 mg | Freq: Four times a day (QID) | INTRAMUSCULAR | Status: DC | PRN

## 2020-06-29 MED ORDER — SODIUM CHLORIDE 0.9 % IV SOLN
100.0000 mg | Freq: Every day | INTRAVENOUS | Status: AC
  Administered 2020-06-30 – 2020-07-03 (×4): 100 mg via INTRAVENOUS
  Filled 2020-06-29: qty 20
  Filled 2020-06-29: qty 100
  Filled 2020-06-29 (×2): qty 20

## 2020-06-29 MED ORDER — METHYLPREDNISOLONE SODIUM SUCC 40 MG IJ SOLR
0.5000 mg/kg | Freq: Two times a day (BID) | INTRAMUSCULAR | Status: DC
  Administered 2020-06-29 – 2020-06-30 (×3): 38.4 mg via INTRAVENOUS
  Filled 2020-06-29 (×3): qty 1

## 2020-06-29 MED ORDER — SODIUM CHLORIDE 0.9% FLUSH: 3.0000 mL | INTRAVENOUS | Status: DC | PRN

## 2020-06-29 MED ORDER — ASCORBIC ACID 500 MG PO TABS
500.0000 mg | ORAL_TABLET | Freq: Every day | ORAL | Status: DC
  Administered 2020-06-29 – 2020-07-04 (×6): 500 mg via ORAL
  Filled 2020-06-29 (×6): qty 1

## 2020-06-29 MED ORDER — ACETAMINOPHEN 325 MG PO TABS
650.0000 mg | ORAL_TABLET | Freq: Four times a day (QID) | ORAL | Status: DC | PRN
  Administered 2020-06-29 – 2020-07-03 (×2): 650 mg via ORAL
  Filled 2020-06-29 (×2): qty 2

## 2020-06-29 NOTE — Progress Notes (Signed)
Remdesivir - Pharmacy Brief Note   O:  ALT: 102 CXR: Marked worsening of widespread patchy bilateral pulmonary infiltrates SpO2: 83% on RA   A/P:  Remdesivir 200 mg IVPB once followed by 100 mg IVPB daily x 4 days.    Crist Fat, PharmD, BCPS Clinical Pharmacist 06/29/2020 11:36 AM

## 2020-06-29 NOTE — ED Provider Notes (Signed)
[facility] Emergency Department Provider Note  ____________________________________________   First MD Initiated Contact with Patient 06/29/20 [phone]     (approximate)  I have reviewed the triage vital signs and the nursing notes.   HISTORY  Chief Complaint Covid Positive   HPI Dawn Ware is a 34 y.o. female without significant past medical history who presents for assessment of worsening shortness of breath associated with nausea, poor p.o. intake, cough, subjective fevers, myalgias, fatigue, poor p.o. intake, and general malaise after being diagnosed with COVID-19 on 8/26.  Patient was referred to the ED after being seen by her PCP earlier today where she received Solu-Medrol 125 mg.  She was referred due to SPO2 saturations less than 90% on room air.  Patient denies any acute chest pain, rash, recent traumatic injuries, hemoptysis, dysuria, blood in her stool, blood in urine, or other acute complaints.  No personal episodes.  No clear alleviating aggravating factors.  Patient endorses prior history of vaping but denies tobacco abuse, EtOH use, illicit drug use.  Denies having received COVID-19 vaccine.         History reviewed. No pertinent past medical history.  Patient Active Problem List   Diagnosis Date Noted  . Pneumonia due to COVID-19 virus 06/29/2020  . Acute respiratory failure due to COVID-19 (HCC) 06/29/2020  . Transaminitis 06/29/2020    No past surgical history on file.  Prior to Admission medications   Not on File    Allergies Patient has no known allergies.  Family History  Problem Relation Age of Onset  . Hypertension Mother   . Hypertension Father     Social History Social History   Tobacco Use  . Smoking status: Current Every Day Smoker  . Smokeless tobacco: Never Used  Vaping Use  . Vaping Use: Every day  Substance Use Topics  . Alcohol use: Not Currently  . Drug use: Not Currently    Review of  Systems  Review of Systems  Constitutional: Positive for chills, fever and malaise/fatigue.  HENT: Negative for sore throat.   Eyes: Negative for pain.  Respiratory: Positive for cough and shortness of breath. Negative for stridor.   Cardiovascular: Negative for chest pain.  Gastrointestinal: Positive for diarrhea and nausea. Negative for vomiting.  Genitourinary: Negative for dysuria.  Musculoskeletal: Positive for myalgias.  Skin: Negative for rash.  Neurological: Negative for seizures, loss of consciousness and headaches.  Psychiatric/Behavioral: Negative for suicidal ideas.  All other systems reviewed and are negative.     ____________________________________________   PHYSICAL EXAM:  VITAL SIGNS: ED Triage Vitals  Enc Vitals Group     BP      Pulse      Resp      Temp      Temp src      SpO2      Weight      Height      Head Circumference      Peak Flow      Pain Score      Pain Loc      Pain Edu?      Excl. in GC?    Vitals:   06/29/20 1330 06/29/20 1345  BP: 123/90   Pulse: 85 92  Resp: (!) 24 (!) 21  Temp:    SpO2: 95% 96%   Physical Exam Vitals and nursing note reviewed.  Constitutional:      General: She is not in acute distress.    Appearance: She is well-developed.  HENT:     Head: Normocephalic and atraumatic.     Right Ear: External ear normal.     Left Ear: External ear normal.     Nose: Nose normal.     Mouth/Throat:     Mouth: Mucous membranes are dry.  Eyes:     Conjunctiva/sclera: Conjunctivae normal.  Cardiovascular:     Rate and Rhythm: Normal rate and regular rhythm.     Heart sounds: No murmur heard.   Pulmonary:     Effort: Pulmonary effort is normal. Tachypnea present. No respiratory distress.     Breath sounds: Examination of the right-middle field reveals decreased breath sounds. Examination of the left-middle field reveals decreased breath sounds. Examination of the right-lower field reveals decreased breath sounds.  Examination of the left-lower field reveals decreased breath sounds. Decreased breath sounds present.  Abdominal:     Palpations: Abdomen is soft.     Tenderness: There is no abdominal tenderness.  Musculoskeletal:     Cervical back: Neck supple. No rigidity.     Right lower leg: No edema.     Left lower leg: No edema.  Skin:    General: Skin is warm and dry.  Neurological:     Mental Status: She is alert and oriented to person, place, and time.  Psychiatric:        Mood and Affect: Mood normal.      ____________________________________________   LABS (all labs ordered are listed, but only abnormal results are displayed)  Labs Reviewed  COMPREHENSIVE METABOLIC PANEL - Abnormal; Notable for the following components:      Result Value   Glucose, Bld 102 (*)    Calcium 8.6 (*)    AST 140 (*)    ALT 102 (*)    All other components within normal limits  FIBRIN DERIVATIVES D-DIMER ([facility] ONLY) - Abnormal; Notable for the following components:   Fibrin derivatives D-dimer ([facility]) 982.42 (*)    All other components within normal limits  BLOOD GAS, VENOUS - Abnormal; Notable for the following components:   pH, Ven 7.46 (*)    Bicarbonate 32.7 (*)    Acid-Base Excess 7.6 (*)    All other components within normal limits  CBC WITH DIFFERENTIAL/PLATELET  MAGNESIUM  BRAIN NATRIURETIC PEPTIDE  HIV ANTIBODY (ROUTINE TESTING W REFLEX)  POC URINE PREG, ED  POCT PREGNANCY, URINE  ABO/RH  TROPONIN I (HIGH SENSITIVITY)  TROPONIN I (HIGH SENSITIVITY)     ____________________________________________  EKG  Sinus rhythm with a ventricular of 87, normal axis, unremarkable intervals, uninterpretable tracing in V3 and nonspecific change in V2 with otherwise no clear evidence of acute ischemia or other significant arrhythmia. ____________________________________________  RADIOLOGY  ED MD interpretation: Bilateral pneumonia.  No evidence of pneumothorax or significant effusion.  Official  radiology report(s): DG Chest 1 View  Result Date: 06/29/2020 CLINICAL DATA:  Hypoxia.  Coronavirus infection. EXAM: CHEST  1 VIEW COMPARISON:  06/25/2020 FINDINGS: Marked worsening of widespread patchy bilateral pulmonary infiltrates. No lobar consolidation or collapse. No visible effusion. IMPRESSION: Marked worsening of widespread patchy bilateral pulmonary infiltrates. Electronically Signed   By: Paulina Fusi M.D.   On: 06/29/2020 10:39   CT Angio Chest PE W and/or Wo Contrast  Result Date: 06/29/2020 CLINICAL DATA:  Chest pain, COVID positive EXAM: CT ANGIOGRAPHY CHEST WITH CONTRAST TECHNIQUE: Multidetector CT imaging of the chest was performed using the standard protocol during bolus administration of intravenous contrast. Multiplanar CT image reconstructions and MIPs were obtained to evaluate the  vascular anatomy. CONTRAST:  OMNIPAQUE IOHEXOL 350 MG/ML SOLN COMPARISON:  None. FINDINGS: Cardiovascular: Satisfactory opacification of the pulmonary arteries to the segmental level. No evidence of pulmonary embolism. Normal heart size. No pericardial effusion. Thoracic aorta is normal in caliber. 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 Mediastinum/Nodes: Small, likely reactive lymph nodes. Visualized thyroid is unremarkable. Lungs/Pleura: Diffuse bilateral consolidative and ground-glass opacities with relative apical sparing. No pleural effusion. No pneumothorax. Upper Abdomen: No acute abnormality. Musculoskeletal: No acute osseous abnormality. Review of the MIP images confirms the above findings. IMPRESSION: No evidence of acute pulmonary embolism. Diffuse bilateral consolidative and ground-glass opacities likely reflecting COVID-19 pneumonia. Electronically Signed   By: Guadlupe Spanish M.D.   On: 06/29/2020 14:32    ____________________________________________   PROCEDURES  Procedure(s) performed (including Critical Care):  .1-3 Lead EKG Interpretation Performed by:  Gilles Chiquito, MD Authorized by: Gilles Chiquito, MD     Interpretation: normal     ECG rate assessment: normal     Rhythm: sinus rhythm     Ectopy: none     Conduction: normal       ____________________________________________   INITIAL IMPRESSION / ASSESSMENT AND PLAN / ED COURSE        Overall patient's history, exam, and ED work-up is most consistent with acute toxic respiratory failure patient requiring 6 L nasal cannula to maintain her SPO2 saturations likely secondary to Covid pneumonia.  CTA chest obtained due to elevated D-dimer shows no evidence of PE.  CBC without evidence of acute anemia or other significant arrangements.  CMP unremarkable.  ECG and troponin are not consistent with ACS, myocarditis, pericarditis, or other acutely life-threatening cardiac etiology.  Patient is not significantly volume overloaded.  She has no evidence of hypercarbic respiratory failure on her VBG.  We will plan to admit to hospital service for further evaluation management including administration of steroids and dentistry.  Will defer Decadron today as patient received Solu-Medrol prior to arrival.          ____________________________________________   FINAL CLINICAL IMPRESSION(S) / ED DIAGNOSES  Final diagnoses:  Acute respiratory failure with hypoxia (HCC)  COVID-19    Medications  enoxaparin (LOVENOX) injection 40 mg (40 mg Subcutaneous Given 06/29/20 1259)  sodium chloride flush (NS) 0.9 % injection 3 mL (3 mLs Intravenous Given 06/29/20 1247)  sodium chloride flush (NS) 0.9 % injection 3 mL (has no administration in time range)  0.9 %  sodium chloride infusion (has no administration in time range)  remdesivir 200 mg in sodium chloride 0.9% 250 mL IVPB (0 mg Intravenous Stopped 06/29/20 1338)    Followed by  remdesivir 100 mg in sodium chloride 0.9 % 100 mL IVPB (has no administration in time range)  albuterol (VENTOLIN HFA) 108 (90 Base) MCG/ACT inhaler 2 puff (2 puffs  Inhalation Given 06/29/20 1350)  methylPREDNISolone sodium succinate (SOLU-MEDROL) 40 mg/mL injection 38.4 mg (38.4 mg Intravenous Given 06/29/20 1259)    Followed by  predniSONE (DELTASONE) tablet 50 mg (has no administration in time range)  guaiFENesin-dextromethorphan (ROBITUSSIN DM) 100-10 MG/5ML syrup 10 mL (has no administration in time range)  ascorbic acid (VITAMIN C) tablet 500 mg (500 mg Oral Given 06/29/20 1259)  zinc sulfate capsule 220 mg (220 mg Oral Given 06/29/20 1259)  acetaminophen (TYLENOL) tablet 650 mg (has no administration in time range)  ondansetron (ZOFRAN) tablet 4 mg (has no administration in time range)    Or  ondansetron (ZOFRAN) injection 4 mg (has  no administration in time range)  lactated ringers bolus 1,000 mL (0 mLs Intravenous Stopped 06/29/20 1258)  ondansetron (ZOFRAN) injection 4 mg (4 mg Intravenous Given 06/29/20 1011)  iohexol (OMNIPAQUE) 350 MG/ML injection 100 mL (100 mLs Intravenous Contrast Given 06/29/20 1404)     ED Discharge Orders    None       Note:  This document was prepared using Dragon voice recognition software and may include unintentional dictation errors.   Gilles Chiquito, MD 06/29/20 [phone]

## 2020-06-29 NOTE — ED Provider Notes (Signed)
[facility]    CSN: [id number] Arrival date & time: 06/29/20  2202      History   Chief Complaint Chief Complaint  Patient presents with  . Covid Positive  . Shortness of Breath  . Chest Pain    HPI Dawn Ware is a 34 y.o. female.   Reports that she is covid positive for the last 3 days. Reports SOB, chest pain, cough, fatigue, headache, weakness for the last 2 days. Reports that she was seen in the ER x 3 days ago but she was not this sick. Reports that she is having a lot of chest tightness and pain accompanied by shortness of breath. Overall fatigued and states that she cannot get a good breath. Has not attempted OTC medications for this. Symptoms worsen with activity. There are not alleviating factors. Denies fever, rash, other symptoms.   ROS per HPI  The history is provided by the patient.  Shortness of Breath Associated symptoms: chest pain   Chest Pain Associated symptoms: shortness of breath     History reviewed. No pertinent past medical history.  Patient Active Problem List   Diagnosis Date Noted  . Pneumonia due to COVID-19 virus 06/29/2020  . Acute respiratory failure due to COVID-19 (HCC) 06/29/2020  . Transaminitis 06/29/2020    History reviewed. No pertinent surgical history.  OB History   No obstetric history on file.      Home Medications    Prior to Admission medications   Not on File    Family History Family History  Problem Relation Age of Onset  . Hypertension Mother   . Hypertension Father     Social History Social History   Tobacco Use  . Smoking status: Current Every Day Smoker  . Smokeless tobacco: Never Used  Vaping Use  . Vaping Use: Former  Substance Use Topics  . Alcohol use: Not Currently  . Drug use: Not Currently     Allergies   Patient has no known allergies.   Review of Systems Review of Systems  Respiratory: Positive for shortness of breath.   Cardiovascular: Positive for chest pain.       Physical Exam Triage Vital Signs ED Triage Vitals [06/29/20 0917]  Enc Vitals Group     BP      Pulse      Resp      Temp      Temp src      SpO2      Weight      Height      Head Circumference      Peak Flow      Pain Score 10     Pain Loc      Pain Edu?      Excl. in GC?    No data found.  Updated Vital Signs Pulse 93   Resp (!) 28   SpO2 (S) (!) 83% Comment: upon arrival RA  Visual Acuity Right Eye Distance:   Left Eye Distance:   Bilateral Distance:    Right Eye Near:   Left Eye Near:    Bilateral Near:     Physical Exam Vitals and nursing note reviewed.  Constitutional:      General: She is in acute distress.     Appearance: She is well-developed. She is ill-appearing.  HENT:     Head: Normocephalic and atraumatic.  Eyes:     Conjunctiva/sclera: Conjunctivae normal.  Cardiovascular:     Rate and Rhythm: Normal rate and  regular rhythm.     Heart sounds: No murmur heard.   Pulmonary:     Effort: Tachypnea and respiratory distress present.     Breath sounds: Examination of the right-upper field reveals wheezing. Examination of the left-upper field reveals wheezing. Examination of the right-middle field reveals rhonchi. Examination of the left-middle field reveals rhonchi. Examination of the right-lower field reveals decreased breath sounds. Examination of the left-lower field reveals decreased breath sounds. Decreased breath sounds, wheezing and rhonchi present.  Abdominal:     Palpations: Abdomen is soft.     Tenderness: There is no abdominal tenderness.  Musculoskeletal:     Cervical back: Normal range of motion and neck supple.  Skin:    General: Skin is warm and dry.     Capillary Refill: Capillary refill takes less than 2 seconds.  Neurological:     General: No focal deficit present.     Mental Status: She is alert and oriented to person, place, and time.  Psychiatric:        Mood and Affect: Mood normal.        Behavior: Behavior normal.       UC Treatments / Results  Labs (all labs ordered are listed, but only abnormal results are displayed) Labs Reviewed - No data to display  EKG   Radiology DG Chest 1 View  Result Date: 06/29/2020 CLINICAL DATA:  Hypoxia.  Coronavirus infection. EXAM: CHEST  1 VIEW COMPARISON:  06/25/2020 FINDINGS: Marked worsening of widespread patchy bilateral pulmonary infiltrates. No lobar consolidation or collapse. No visible effusion. IMPRESSION: Marked worsening of widespread patchy bilateral pulmonary infiltrates. Electronically Signed   By: Paulina Fusi M.D.   On: 06/29/2020 10:39   CT Angio Chest PE W and/or Wo Contrast  Result Date: 06/29/2020 CLINICAL DATA:  Chest pain, COVID positive EXAM: CT ANGIOGRAPHY CHEST WITH CONTRAST TECHNIQUE: Multidetector CT imaging of the chest was performed using the standard protocol during bolus administration of intravenous contrast. Multiplanar CT image reconstructions and MIPs were obtained to evaluate the vascular anatomy. CONTRAST:  OMNIPAQUE IOHEXOL 350 MG/ML SOLN COMPARISON:  None. FINDINGS: Cardiovascular: Satisfactory opacification of the pulmonary arteries to the segmental level. No evidence of pulmonary embolism. Normal heart size. No pericardial effusion. Thoracic aorta is normal in caliber. 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 Mediastinum/Nodes: Small, likely reactive lymph nodes. Visualized thyroid is unremarkable. Lungs/Pleura: Diffuse bilateral consolidative and ground-glass opacities with relative apical sparing. No pleural effusion. No pneumothorax. Upper Abdomen: No acute abnormality. Musculoskeletal: No acute osseous abnormality. Review of the MIP images confirms the above findings. IMPRESSION: No evidence of acute pulmonary embolism. Diffuse bilateral consolidative and ground-glass opacities likely reflecting COVID-19 pneumonia. Electronically Signed   By: Guadlupe Spanish M.D.   On: 06/29/2020 14:32     Procedures Procedures (including critical care time)  Medications Ordered in UC Medications  methylPREDNISolone sodium succinate (SOLU-MEDROL) 125 mg/2 mL injection 125 mg (125 mg Intramuscular Given 06/29/20 0915)    Initial Impression / Assessment and Plan / UC Course  I have reviewed the triage vital signs and the nursing notes.  Pertinent labs & imaging results that were available during my care of the patient were reviewed by me and considered in my medical decision making (see chart for details).     Covid 19 SOB Hypoxia Chest pain Cough Fatigue Weakness Headache Acute Respiratory Distress  Solumedrol 125mg  in office today Albuterol x 2 puffs in office today Initial O2 sats 83% on RA Alda  placed with 4LPM O2, with O2 sats up to 89. Nonrebreather mask placed and O2 up to 8LPM, O2 sats to 95% Discussed with patient that given hypoxia with a Covid diagnosis, coarse lung sounds, that she would be better served in the ER Agrees with plan  To [facility] ER via EMS Final Clinical Impressions(s) / UC Diagnoses   Final diagnoses:  COVID-19  SOB (shortness of breath)  Hypoxia  Chest pain, unspecified type  Cough  Other fatigue  Generalized weakness  Nonintractable headache, unspecified chronicity pattern, unspecified headache type  Acute respiratory distress     Discharge Instructions     Go to the ER for further evaluation and treatment    ED Prescriptions    None     PDMP not reviewed this encounter.   Moshe Cipro, NP 06/30/20 1117

## 2020-06-29 NOTE — ED Notes (Signed)
Patient transported to CT 

## 2020-06-29 NOTE — ED Triage Notes (Signed)
Pt presents with Chest pain, labored breathing and SOB. She states she believes she has pneumonia.

## 2020-06-29 NOTE — ED Notes (Signed)
Pt given meal tray.

## 2020-06-29 NOTE — H&P (Signed)
History and Physical    Dawn Ware SVX:[id number] DOB: 07-22-1986 DOA: 06/29/2020  PCP: Patient, No Pcp Per   Patient coming from: Home  I have personally briefly reviewed patient's old medical records in [facility]  Chief Complaint: Chest pain                               Shortness of breath  HPI: Dawn Ware is a 34 y.o. female with no significant past medical history who presents to the emergency room for evaluation of chest pain mostly pleuritic associated with a refractory cough.  Patient tested positive for the COVID-19 virus on 06/21/20.  She had symptoms for 6 days prior to testing positive and was seen in the emergency room on 06/25/20 with similar complaints.  Patient at that time had pulse oximetry with exertion of 93 to 94% and was discharged home. She returns to the emergency room 4 days later from the urgent care center where she had gone for evaluation of worsening chest pain and shortness of breath.  At the urgent care she was noted to be hypoxic with room air pulse oximetry in the 80s.  Patient was placed on a nonrebreather mask with improvement in her pulse oximetry to 97%.  She also received a dose of IM Solu-Medrol. She is unvaccinated Labs show VBG 7.46/46/32/17.3, sodium 137, potassium 4, chloride 100, bicarb 25, BUN 7, creatinine 0.70, magnesium 2.0, AST 140, ALT 102, BNP 30, white count 5.8, hemoglobin 13.8, hematocrit 41.2 Chest x-ray reviewed by me shows marked worsening of widespread patchy bilateral pulmonary infiltrate when compared to prior x-ray. Twelve-lead EKG reviewed by me shows sinus rhythm   ED Course: Patient is a 34 year old female who presents to the ER for evaluation of worsening pleuritic chest pain and shortness of breath.  Patient tested positive for the COVID-19 virus on 06/21/20 but had symptoms 6 days prior to testing positive.  She was seen at urgent care and referred to the ER for hypoxia.  Room air pulse oximetry was in the 80s and  improved following oxygen supplementation.  She received a dose of IM Solu-Medrol at the urgent care center.  She received remdesivir in the ER and will be admitted to the hospital for further evaluation.  Review of Systems: As per HPI otherwise 10 point review of systems negative.    No past medical history on file.  No past surgical history on file.   reports that she has been smoking. She has never used smokeless tobacco. She reports previous alcohol use. She reports previous drug use.  No Known Allergies  Family History  Problem Relation Age of Onset  . Hypertension Mother   . Hypertension Father      Prior to Admission medications   Not on File    Physical Exam: Vitals:   06/29/20 1006 06/29/20 1007  BP: 127/84   Pulse: 90   Resp: (!) 22   Temp: 97.9 F (36.6 C)   TempSrc: Oral   SpO2: 97%   Weight:  76.7 kg  Height:  5\' 5"  (1.651 m)     Vitals:   06/29/20 1006 06/29/20 1007  BP: 127/84   Pulse: 90   Resp: (!) 22   Temp: 97.9 F (36.6 C)   TempSrc: Oral   SpO2: 97%   Weight:  76.7 kg  Height:  5\' 5"  (1.651 m)    Constitutional: NAD, alert and oriented x 3  Eyes: PERRL, lids and conjunctivae normal ENMT: Mucous membranes are moist.  Neck: normal, supple, no masses, no thyromegaly Respiratory: Bilateral air entry, no wheezing, no crackles. Normal respiratory effort. No accessory muscle use.  Cardiovascular: Regular rate and rhythm, no murmurs / rubs / gallops. No extremity edema. 2+ pedal pulses. No carotid bruits.  Abdomen: no tenderness, no masses palpated. No hepatosplenomegaly. Bowel sounds positive.  Musculoskeletal: no clubbing / cyanosis. No joint deformity upper and lower extremities.  Skin: no rashes, lesions, ulcers.  Neurologic: No gross focal neurologic deficit. Psychiatric: Normal mood and affect.   Labs on Admission: I have personally reviewed following labs and imaging studies  CBC: Recent Labs  Lab 06/25/20 0611 06/29/20 1006    WBC 4.5 5.8  NEUTROABS  --  4.2  HGB 13.1 13.8  HCT 39.7 41.2  MCV 93.0 90.9  PLT 102* 158   Basic Metabolic Panel: Recent Labs  Lab 06/25/20 0611 06/29/20 1006  NA 137 137  K 3.6 4.0  CL 102 100  CO2 29 25  GLUCOSE 93 102*  BUN 8 7  CREATININE 0.64 0.70  CALCIUM 8.7* 8.6*  MG  --  2.0   GFR: Estimated Creatinine Clearance: 100.6 mL/min (by C-G formula based on SCr of 0.7 mg/dL). Liver Function Tests: Recent Labs  Lab 06/25/20 0611 06/29/20 1006  AST 63* 140*  ALT 55* 102*  ALKPHOS 47 45  BILITOT 0.4 1.0  PROT 7.8 7.3  ALBUMIN 4.3 3.8   No results for input(s): LIPASE, AMYLASE in the last 168 hours. No results for input(s): AMMONIA in the last 168 hours. Coagulation Profile: No results for input(s): INR, PROTIME in the last 168 hours. Cardiac Enzymes: No results for input(s): CKTOTAL, CKMB, CKMBINDEX, TROPONINI in the last 168 hours. BNP (last 3 results) No results for input(s): PROBNP in the last 8760 hours. HbA1C: No results for input(s): HGBA1C in the last 72 hours. CBG: No results for input(s): GLUCAP in the last 168 hours. Lipid Profile: No results for input(s): CHOL, HDL, LDLCALC, TRIG, CHOLHDL, LDLDIRECT in the last 72 hours. Thyroid Function Tests: No results for input(s): TSH, T4TOTAL, FREET4, T3FREE, THYROIDAB in the last 72 hours. Anemia Panel: No results for input(s): VITAMINB12, FOLATE, FERRITIN, TIBC, IRON, RETICCTPCT in the last 72 hours. Urine analysis: No results found for: COLORURINE, APPEARANCEUR, LABSPEC, PHURINE, GLUCOSEU, HGBUR, BILIRUBINUR, KETONESUR, PROTEINUR, UROBILINOGEN, NITRITE, LEUKOCYTESUR  Radiological Exams on Admission: DG Chest 1 View  Result Date: 06/29/2020 CLINICAL DATA:  Hypoxia.  Coronavirus infection. EXAM: CHEST  1 VIEW COMPARISON:  06/25/2020 FINDINGS: Marked worsening of widespread patchy bilateral pulmonary infiltrates. No lobar consolidation or collapse. No visible effusion. IMPRESSION: Marked worsening of  widespread patchy bilateral pulmonary infiltrates. Electronically Signed   By: Paulina Fusi M.D.   On: 06/29/2020 10:39    EKG: Independently reviewed.  Normal sinus rhythm  Assessment/Plan Principal Problem:   Pneumonia due to COVID-19 virus Active Problems:   Acute respiratory failure due to COVID-19 [facility])   Transaminitis    Pneumonia and acute respiratory failure due to COVID-19 virus Patient presents to the ER for evaluation of worsening shortness of breath as well as pleuritic chest pain.   She had been seen in the emergency room 4 days prior to her admission for similar complaints but was not hypoxic and so was discharged home. Today on admission she was noted to be hypoxic in the urgent care center with room air pulse oximetry in the 80s and this improved with oxygen supplementation to  the upper 90s We will place patient on IV Solu-Medrol Place patient on remdesivir per protocol Continue oxygen supplementation to maintain pulse oximetry greater than 92%   Transaminitis Unclear etiology may be related to COVID-19 viral infection  DVT prophylaxis: Lovenox Code Status: Full code Family Communication: Greater than 50% of time was spent discussing patient's condition and plan of care with patient at the bedside.  She verbalizes understanding and agrees with the plan. Disposition Plan: Back to previous home environment Consults called: None    Connery Shiffler MD [facility]     06/29/2020, 11:48 AM

## 2020-06-29 NOTE — ED Triage Notes (Signed)
Pt here via [facility] from UC with covid + and SOB. Pt tested postive on Thursday and had o2 sats in the 80's at [facility]. Pt NAD on arrival to ED.

## 2020-06-29 NOTE — ED Triage Notes (Signed)
Pt placed on NRB per NP verbal order.  Increased to 8L.  O2 sat increased to 97%.  125mg  Solumedrol IM given per VO.  EMS to bedside.   Patient is being discharged from the Urgent Care and sent to the Emergency Department via EMS . Per NP, patient is in need of higher level of care due to COVID positive with hypoxia. Patient is aware and verbalizes understanding of plan of care.

## 2020-06-30 LAB — COMPREHENSIVE METABOLIC PANEL
ALT: 93 U/L — ABNORMAL HIGH (ref 0–44)
ALT: 89 U/L — ABNORMAL HIGH (ref 0–44)
AST: 92 U/L — ABNORMAL HIGH (ref 15–41)
AST: 86 U/L — ABNORMAL HIGH (ref 15–41)
Albumin: 3.7 g/dL (ref 3.5–5.0)
Albumin: 3.3 g/dL — ABNORMAL LOW (ref 3.5–5.0)
Alkaline Phosphatase: 48 U/L (ref 38–126)
Alkaline Phosphatase: 47 U/L (ref 38–126)
Anion gap: 10 (ref 5–15)
Anion gap: 6 (ref 5–15)
BUN: 10 mg/dL (ref 6–20)
BUN: 9 mg/dL (ref 6–20)
CO2: 25 mmol/L (ref 22–32)
CO2: 25 mmol/L (ref 22–32)
Calcium: 8.9 mg/dL (ref 8.9–10.3)
Calcium: 9 mg/dL (ref 8.9–10.3)
Chloride: 103 mmol/L (ref 98–111)
Chloride: 105 mmol/L (ref 98–111)
Creatinine, Ser: 0.62 mg/dL (ref 0.44–1.00)
Creatinine, Ser: 0.51 mg/dL (ref 0.44–1.00)
GFR calc Af Amer: >60 mL/min (ref >60)
GFR calc Af Amer: >60 mL/min (ref >60)
GFR calc non Af Amer: >60 mL/min (ref >60)
GFR calc non Af Amer: >60 mL/min (ref >60)
Glucose, Bld: 167 mg/dL — ABNORMAL HIGH (ref 70–99)
Glucose, Bld: 128 mg/dL — ABNORMAL HIGH (ref 70–99)
Potassium: 4.1 mmol/L (ref 3.5–5.1)
Potassium: 3.9 mmol/L (ref 3.5–5.1)
Sodium: 138 mmol/L (ref 135–145)
Sodium: 136 mmol/L (ref 135–145)
Total Bilirubin: 0.4 mg/dL (ref 0.3–1.2)
Total Bilirubin: 0.5 mg/dL (ref 0.3–1.2)
Total Protein: 7.3 g/dL (ref 6.5–8.1)
Total Protein: 6.7 g/dL (ref 6.5–8.1)

## 2020-06-30 LAB — CBC WITH DIFFERENTIAL/PLATELET
Abs Immature Granulocytes: 0.06 10*3/uL (ref 0.00–0.07)
Abs Immature Granulocytes: 0.1 10*3/uL — ABNORMAL HIGH (ref 0.00–0.07)
Basophils Absolute: 0 10*3/uL (ref 0.0–0.1)
Basophils Absolute: 0 10*3/uL (ref 0.0–0.1)
Basophils Relative: 0 %
Basophils Relative: 0 %
Eosinophils Absolute: 0 10*3/uL (ref 0.0–0.5)
Eosinophils Absolute: 0 10*3/uL (ref 0.0–0.5)
Eosinophils Relative: 0 %
Eosinophils Relative: 0 %
HCT: 38 % (ref 36.0–46.0)
HCT: 35 % — ABNORMAL LOW (ref 36.0–46.0)
Hemoglobin: 12.6 g/dL (ref 12.0–15.0)
Hemoglobin: 11.7 g/dL — ABNORMAL LOW (ref 12.0–15.0)
Immature Granulocytes: 1 %
Immature Granulocytes: 1 %
Lymphocytes Relative: 10 %
Lymphocytes Relative: 9 %
Lymphs Abs: 0.5 10*3/uL — ABNORMAL LOW (ref 0.7–4.0)
Lymphs Abs: 0.8 10*3/uL (ref 0.7–4.0)
MCH: 30.5 pg (ref 26.0–34.0)
MCH: 30.5 pg (ref 26.0–34.0)
MCHC: 33.2 g/dL (ref 30.0–36.0)
MCHC: 33.4 g/dL (ref 30.0–36.0)
MCV: 92 fL (ref 80.0–100.0)
MCV: 91.1 fL (ref 80.0–100.0)
Monocytes Absolute: 0.5 10*3/uL (ref 0.1–1.0)
Monocytes Absolute: 1.3 10*3/uL — ABNORMAL HIGH (ref 0.1–1.0)
Monocytes Relative: 9 %
Monocytes Relative: 15 %
Neutro Abs: 4.2 10*3/uL (ref 1.7–7.7)
Neutro Abs: 6.2 10*3/uL (ref 1.7–7.7)
Neutrophils Relative %: 80 %
Neutrophils Relative %: 75 %
Platelets: 203 10*3/uL (ref 150–400)
Platelets: 233 10*3/uL (ref 150–400)
RBC: 4.13 MIL/uL (ref 3.87–5.11)
RBC: 3.84 MIL/uL — ABNORMAL LOW (ref 3.87–5.11)
RDW: 12.7 % (ref 11.5–15.5)
RDW: 12.6 % (ref 11.5–15.5)
Smear Review: NORMAL
WBC: 5.3 10*3/uL (ref 4.0–10.5)
WBC: 8.4 10*3/uL (ref 4.0–10.5)
nRBC: 0 % (ref 0.0–0.2)
nRBC: 0 % (ref 0.0–0.2)

## 2020-06-30 LAB — FIBRIN DERIVATIVES D-DIMER (ARMC ONLY)
Fibrin derivatives D-dimer (ARMC): 703.29 ng/mL (FEU) — ABNORMAL HIGH (ref 0.00–499.00)
Fibrin derivatives D-dimer (ARMC): 585.51 ng/mL (FEU) — ABNORMAL HIGH (ref 0.00–499.00)

## 2020-06-30 LAB — PHOSPHORUS
Phosphorus: 3.4 mg/dL (ref 2.5–4.6)
Phosphorus: 2.9 mg/dL (ref 2.5–4.6)

## 2020-06-30 LAB — C-REACTIVE PROTEIN
CRP: 4.2 mg/dL — ABNORMAL HIGH (ref <1.0)
CRP: 3.2 mg/dL — ABNORMAL HIGH (ref <1.0)

## 2020-06-30 LAB — LACTATE DEHYDROGENASE: LDH: 570 U/L — ABNORMAL HIGH (ref 98–192)

## 2020-06-30 LAB — MAGNESIUM
Magnesium: 2.2 mg/dL (ref 1.7–2.4)
Magnesium: 1.9 mg/dL (ref 1.7–2.4)

## 2020-06-30 LAB — FERRITIN
Ferritin: 731 ng/mL — ABNORMAL HIGH (ref 11–307)
Ferritin: 552 ng/mL — ABNORMAL HIGH (ref 11–307)

## 2020-06-30 MED ORDER — SODIUM CHLORIDE 0.9 % IV SOLN: INTRAVENOUS | Status: DC | PRN

## 2020-06-30 MED ORDER — ENOXAPARIN SODIUM 40 MG/0.4ML ~~LOC~~ SOLN
40.0000 mg | SUBCUTANEOUS | Status: DC
  Administered 2020-06-30 – 2020-07-03 (×3): 40 mg via SUBCUTANEOUS
  Filled 2020-06-30 (×4): qty 0.4

## 2020-06-30 MED ORDER — IPRATROPIUM-ALBUTEROL 20-100 MCG/ACT IN AERS
1.0000 | INHALATION_SPRAY | Freq: Four times a day (QID) | RESPIRATORY_TRACT | Status: DC
  Administered 2020-06-30 – 2020-07-04 (×14): 1 via RESPIRATORY_TRACT
  Filled 2020-06-30: qty 4

## 2020-06-30 MED ORDER — BARICITINIB 2 MG PO TABS
4.0000 mg | ORAL_TABLET | Freq: Every day | ORAL | Status: DC
  Administered 2020-06-30 – 2020-07-04 (×5): 4 mg via ORAL
  Filled 2020-06-30 (×6): qty 2

## 2020-06-30 MED ORDER — METHYLPREDNISOLONE SODIUM SUCC 40 MG IJ SOLR
40.0000 mg | Freq: Two times a day (BID) | INTRAMUSCULAR | Status: DC
  Administered 2020-07-01 – 2020-07-04 (×8): 40 mg via INTRAVENOUS
  Filled 2020-06-30 (×8): qty 1

## 2020-06-30 MED ORDER — DOXYLAMINE SUCCINATE (SLEEP) 25 MG PO TABS
25.0000 mg | ORAL_TABLET | Freq: Every evening | ORAL | Status: DC | PRN
  Administered 2020-07-01: 25 mg via ORAL
  Filled 2020-06-30 (×2): qty 1

## 2020-06-30 NOTE — Consult Note (Signed)
ANTICOAGULATION CONSULT NOTE  Pharmacy Consult for Enoxaparin Indication: VTE prophylaxis  Patient Measurements: Height: 5\' 5"  (165.1 cm) Weight: 76.7 kg (169 lb) IBW/kg (Calculated) : 57  Labs: Recent Labs    06/29/20 1006 06/29/20 1006 06/29/20 1116 06/30/20 0631 06/30/20 1058  HGB 13.8   < >  --  12.6 11.7*  HCT 41.2  --   --  38.0 35.0*  PLT 158  --   --  203 233  CREATININE 0.70  --   --  0.62 0.51  TROPONINIHS 5  --  7  --   --    < > = values in this interval not displayed.    Estimated Creatinine Clearance: 100.6 mL/min (by C-G formula based on SCr of 0.51 mg/dL).  Assessment: Patient is a 34 y/o F who is admitted with COVID-19 pneumonia. 9/3 CTA negative for PE. D-dimer is trending down. Pharmacy consulted for enoxaparin dosing for VTE prophylaxis.   Plan:  --Based on patient parameters, will continue with standard prophylactic intensity enoxaparin dosing of 40 mg q24h --Scr at least weekly per protocol  11/3 06/30/2020,6:45 PM

## 2020-06-30 NOTE — Progress Notes (Signed)
PROGRESS NOTE    Dawn Ware  XBM:[id number] DOB: [birth year] DOA: 06/29/2020 PCP: Patient, No Pcp Per     Brief Narrative:  34 y.o. WF PMHx none.   Presents to the emergency room for evaluation of chest pain mostly pleuritic associated with a refractory cough.  Patient tested positive for the COVID-19 virus on 06/21/20.  She had symptoms for 6 days prior to testing positive and was seen in the emergency room on 06/25/20 with similar complaints.  Patient at that time had pulse oximetry with exertion of 93 to 94% and was discharged home. She returns to the emergency room 4 days later from the urgent care center where she had gone for evaluation of worsening chest pain and shortness of breath.  At the urgent care she was noted to be hypoxic with room air pulse oximetry in the 80s.  Patient was placed on a nonrebreather mask with improvement in her pulse oximetry to 97%.  She also received a dose of IM Solu-Medrol. She is unvaccinated Labs show VBG 7.46/46/32/17.3, sodium 137, potassium 4, chloride 100, bicarb 25, BUN 7, creatinine 0.70, magnesium 2.0, AST 140, ALT 102, BNP 30, white count 5.8, hemoglobin 13.8, hematocrit 41.2 Chest x-ray reviewed by me shows marked worsening of widespread patchy bilateral pulmonary infiltrate when compared to prior x-ray. Twelve-lead EKG reviewed by me shows sinus rhythm   ED Course: Patient is a 34 year old female who presents to the ER for evaluation of worsening pleuritic chest pain and shortness of breath.  Patient tested positive for the COVID-19 virus on 06/21/20 but had symptoms 6 days prior to testing positive.  She was seen at urgent care and referred to the ER for hypoxia.  Room air pulse oximetry was in the 80s and improved following oxygen supplementation.  She received a dose of IM Solu-Medrol at the urgent care center.  She received remdesivir in the ER and will be admitted to the hospital for further evaluation.   Subjective: A/O x4, positive S  OB, negative CP, negative abdominal pain.   Assessment & Plan: Covid vaccination; no vaccination   Principal Problem:   Pneumonia due to COVID-19 virus Active Problems:   Acute respiratory failure due to COVID-19 [facility])   Transaminitis  Acute respiratory failure with hypoxia/Covid pneumonia  COVID-19 Labs  Recent Labs    06/30/20 0631 06/30/20 1058  FERRITIN 731* 552*  LDH  --  570*  CRP 4.2*  --     No results found for: SARSCOV2NAA  -Remdesivir per pharmacy protocol -Solu-Medrol 40 mg BID -Baricitinib 4 mg x 14 days -Combivent QID -Vitamin C and zinc per Covid protocol      DVT prophylaxis: Lovenox Code Status: Full Family Communication:  Status is: Inpatient    Dispo: The patient is from: Home              Anticipated d/c is to: Home              Anticipated d/c date is: 9/11              Patient currently unstable      Consultants:    Procedures/Significant Events:    I have personally reviewed and interpreted all radiology studies and my findings are as above.  VENTILATOR SETTINGS: HFNC 9/4 Flow; 6 L/min SPO2; 94%   Cultures 8/26 SARS coronavirus positive   Antimicrobials: Anti-infectives (From admission, onward)   Start     Ordered Stop   06/30/20 1000  remdesivir 100 mg in  sodium chloride 0.9 % 100 mL IVPB       "Followed by" Linked Group Details   06/29/20 1109 07/04/20 0959   06/29/20 1230  remdesivir 200 mg in sodium chloride 0.9% 250 mL IVPB       "Followed by" Linked Group Details   06/29/20 1109 06/29/20 1338       Devices    LINES / TUBES:      Continuous Infusions: . sodium chloride    . remdesivir 100 mg in NS 100 mL       Objective: Vitals:   06/29/20 1653 06/29/20 1717 06/29/20 2027 06/30/20 0606  BP: 131/78 106/75 121/85 134/85  Pulse: 88 85 82 94  Resp: (!) 30 18 18 18   Temp:  97.7 F (36.5 C) (!) 97.5 F (36.4 C) 97.6 F (36.4 C)  TempSrc:  Oral Oral Oral  SpO2: 97% 95% 97% 91%  Weight:       Height:        Intake/Output Summary (Last 24 hours) at 06/30/2020 08/30/2020 Last data filed at 06/29/2020 1338 Gross per 24 hour  Intake 1250 ml  Output --  Net 1250 ml   Filed Weights   06/29/20 1007  Weight: 76.7 kg    Examination:  General: A/O x4, positive acute respiratory distress Eyes: negative scleral hemorrhage, negative anisocoria, negative icterus ENT: Negative Runny nose, negative gingival bleeding, Neck:  Negative scars, masses, torticollis, lymphadenopathy, JVD Lungs: tachypneic decreased breath sounds bilaterally without wheezes, positiver bibasilar crackles Cardiovascular: Regular rate and rhythm without murmur gallop or rub normal S1 and S2 Abdomen: negative abdominal pain, nondistended, positive soft, bowel sounds, no rebound, no ascites, no appreciable mass Extremities: No significant cyanosis, clubbing, or edema bilateral lower extremities Skin: Negative rashes, lesions, ulcers Psychiatric:  Negative depression, negative anxiety, negative fatigue, negative mania  Central nervous system:  Cranial nerves II through XII intact, tongue/uvula midline, all extremities muscle strength 5/5, sensation intact throughout, negative dysarthria, negative expressive aphasia, negative receptive aphasia.  .     Data Reviewed: Care during the described time interval was provided by me .  I have reviewed this patient's available data, including medical history, events of note, physical examination, and all test results as part of my evaluation.  CBC: Recent Labs  Lab 06/25/20 0611 06/29/20 1006 06/30/20 0631  WBC 4.5 5.8 5.3  NEUTROABS  --  4.2 4.2  HGB 13.1 13.8 12.6  HCT 39.7 41.2 38.0  MCV 93.0 90.9 92.0  PLT 102* 158 203   Basic Metabolic Panel: Recent Labs  Lab 06/25/20 0611 06/29/20 1006 06/30/20 0631  NA 137 137 138  K 3.6 4.0 4.1  CL 102 100 103  CO2 29 25 25   GLUCOSE 93 102* 167*  BUN 8 7 10   CREATININE 0.64 0.70 0.62  CALCIUM 8.7* 8.6* 8.9  MG  --   2.0 2.2  PHOS  --   --  3.4   GFR: Estimated Creatinine Clearance: 100.6 mL/min (by C-G formula based on SCr of 0.62 mg/dL). Liver Function Tests: Recent Labs  Lab 06/25/20 0611 06/29/20 1006 06/30/20 0631  AST 63* 140* 92*  ALT 55* 102* 93*  ALKPHOS 47 45 48  BILITOT 0.4 1.0 0.4  PROT 7.8 7.3 7.3  ALBUMIN 4.3 3.8 3.7   No results for input(s): LIPASE, AMYLASE in the last 168 hours. No results for input(s): AMMONIA in the last 168 hours. Coagulation Profile: No results for input(s): INR, PROTIME in the last 168 hours. Cardiac Enzymes:  No results for input(s): CKTOTAL, CKMB, CKMBINDEX, TROPONINI in the last 168 hours. BNP (last 3 results) No results for input(s): PROBNP in the last 8760 hours. HbA1C: No results for input(s): HGBA1C in the last 72 hours. CBG: No results for input(s): GLUCAP in the last 168 hours. Lipid Profile: No results for input(s): CHOL, HDL, LDLCALC, TRIG, CHOLHDL, LDLDIRECT in the last 72 hours. Thyroid Function Tests: No results for input(s): TSH, T4TOTAL, FREET4, T3FREE, THYROIDAB in the last 72 hours. Anemia Panel: Recent Labs    06/30/20 0631  FERRITIN 731*   Sepsis Labs: Recent Labs  Lab 06/25/20 2956  LATICACIDVEN 0.8    No results found for this or any previous visit (from the past 240 hour(s)).       Radiology Studies: DG Chest 1 View  Result Date: 06/29/2020 CLINICAL DATA:  Hypoxia.  Coronavirus infection. EXAM: CHEST  1 VIEW COMPARISON:  06/25/2020 FINDINGS: Marked worsening of widespread patchy bilateral pulmonary infiltrates. No lobar consolidation or collapse. No visible effusion. IMPRESSION: Marked worsening of widespread patchy bilateral pulmonary infiltrates. Electronically Signed   By: Paulina Fusi M.D.   On: 06/29/2020 10:39   CT Angio Chest PE W and/or Wo Contrast  Result Date: 06/29/2020 CLINICAL DATA:  Chest pain, COVID positive EXAM: CT ANGIOGRAPHY CHEST WITH CONTRAST TECHNIQUE: Multidetector CT imaging of the chest  was performed using the standard protocol during bolus administration of intravenous contrast. Multiplanar CT image reconstructions and MIPs were obtained to evaluate the vascular anatomy. CONTRAST:  OMNIPAQUE IOHEXOL 350 MG/ML SOLN COMPARISON:  None. FINDINGS: Cardiovascular: Satisfactory opacification of the pulmonary arteries to the segmental level. No evidence of pulmonary embolism. Normal heart size. No pericardial effusion. Thoracic aorta is normal in caliber. 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 Mediastinum/Nodes: Small, likely reactive lymph nodes. Visualized thyroid is unremarkable. Lungs/Pleura: Diffuse bilateral consolidative and ground-glass opacities with relative apical sparing. No pleural effusion. No pneumothorax. Upper Abdomen: No acute abnormality. Musculoskeletal: No acute osseous abnormality. Review of the MIP images confirms the above findings. IMPRESSION: No evidence of acute pulmonary embolism. Diffuse bilateral consolidative and ground-glass opacities likely reflecting COVID-19 pneumonia. Electronically Signed   By: Guadlupe Spanish M.D.   On: 06/29/2020 14:32        Scheduled Meds: . albuterol  2 puff Inhalation Q6H  . vitamin C  500 mg Oral Daily  . enoxaparin (LOVENOX) injection  40 mg Subcutaneous Q24H  . methylPREDNISolone (SOLU-MEDROL) injection  0.5 mg/kg Intravenous Q12H   Followed by  . [START ON 07/02/2020] predniSONE  50 mg Oral Daily  . sodium chloride flush  3 mL Intravenous Q12H  . zinc sulfate  220 mg Oral Daily   Continuous Infusions: . sodium chloride    . remdesivir 100 mg in NS 100 mL       LOS: 1 day    Time spent:40 min    Mana Morison, Roselind Messier, MD [facility] Pager [phone]  If 7PM-7AM, please contact night-coverage [email or web address] Password [facility] 06/30/2020, 8:38 AM

## 2020-06-30 NOTE — Discharge Instructions (Addendum)
Go to the ER for further evaluation and treatment. 

## 2020-07-01 LAB — COMPREHENSIVE METABOLIC PANEL
ALT: 87 U/L — ABNORMAL HIGH (ref 0–44)
AST: 71 U/L — ABNORMAL HIGH (ref 15–41)
Albumin: 3.4 g/dL — ABNORMAL LOW (ref 3.5–5.0)
Alkaline Phosphatase: 43 U/L (ref 38–126)
Anion gap: 9 (ref 5–15)
BUN: 14 mg/dL (ref 6–20)
CO2: 27 mmol/L (ref 22–32)
Calcium: 8.7 mg/dL — ABNORMAL LOW (ref 8.9–10.3)
Chloride: 103 mmol/L (ref 98–111)
Creatinine, Ser: 0.56 mg/dL (ref 0.44–1.00)
GFR calc Af Amer: >60 mL/min (ref >60)
GFR calc non Af Amer: >60 mL/min (ref >60)
Glucose, Bld: 122 mg/dL — ABNORMAL HIGH (ref 70–99)
Potassium: 4.3 mmol/L (ref 3.5–5.1)
Sodium: 139 mmol/L (ref 135–145)
Total Bilirubin: 0.5 mg/dL (ref 0.3–1.2)
Total Protein: 6.8 g/dL (ref 6.5–8.1)

## 2020-07-01 LAB — CBC WITH DIFFERENTIAL/PLATELET
Abs Immature Granulocytes: 0.17 10*3/uL — ABNORMAL HIGH (ref 0.00–0.07)
Basophils Absolute: 0 10*3/uL (ref 0.0–0.1)
Basophils Relative: 0 %
Eosinophils Absolute: 0 10*3/uL (ref 0.0–0.5)
Eosinophils Relative: 0 %
HCT: 36.9 % (ref 36.0–46.0)
Hemoglobin: 12.3 g/dL (ref 12.0–15.0)
Immature Granulocytes: 2 %
Lymphocytes Relative: 9 %
Lymphs Abs: 0.8 10*3/uL (ref 0.7–4.0)
MCH: 30.7 pg (ref 26.0–34.0)
MCHC: 33.3 g/dL (ref 30.0–36.0)
MCV: 92 fL (ref 80.0–100.0)
Monocytes Absolute: 0.8 10*3/uL (ref 0.1–1.0)
Monocytes Relative: 9 %
Neutro Abs: 7.3 10*3/uL (ref 1.7–7.7)
Neutrophils Relative %: 80 %
Platelets: 297 10*3/uL (ref 150–400)
RBC: 4.01 MIL/uL (ref 3.87–5.11)
RDW: 12.5 % (ref 11.5–15.5)
Smear Review: NORMAL
WBC: 9.1 10*3/uL (ref 4.0–10.5)
nRBC: 0 % (ref 0.0–0.2)

## 2020-07-01 LAB — HEPATITIS PANEL, ACUTE
HCV Ab: NONREACTIVE
Hep A IgM: NONREACTIVE
Hep B C IgM: NONREACTIVE
Hepatitis B Surface Ag: NONREACTIVE

## 2020-07-01 LAB — FIBRIN DERIVATIVES D-DIMER (ARMC ONLY): Fibrin derivatives D-dimer (ARMC): 1156.31 ng/mL (FEU) — ABNORMAL HIGH (ref 0.00–499.00)

## 2020-07-01 LAB — PHOSPHORUS: Phosphorus: 3.4 mg/dL (ref 2.5–4.6)

## 2020-07-01 LAB — C-REACTIVE PROTEIN: CRP: 1.4 mg/dL — ABNORMAL HIGH (ref <1.0)

## 2020-07-01 LAB — LACTATE DEHYDROGENASE: LDH: 509 U/L — ABNORMAL HIGH (ref 98–192)

## 2020-07-01 LAB — MAGNESIUM: Magnesium: 2.2 mg/dL (ref 1.7–2.4)

## 2020-07-01 LAB — FERRITIN: Ferritin: 408 ng/mL — ABNORMAL HIGH (ref 11–307)

## 2020-07-01 NOTE — TOC Initial Note (Signed)
Transition of Care [facility]) - Initial/Assessment Note    Patient Details  Name: Dawn Ware MRN: [id number] Date of Birth: 04-18-1986  [facility] CM/SW Contact:    Allayne Butcher, RN Phone Number: 07/01/2020, 11:29 AM  Clinical Narrative:                 Patient admitted to the hospital with COVID acute O2 HFNC 6L.  RNCM consulted for financial resources and medication assistance.  RNCM was able to speak with patient via phone.  Patient lives with her boyfriend and is independent in ADL's.  Patient drives and works at [employer].  Patient currently does not have any insurance and has no PCP.  RNCM has provided patient with information on [facility], Medication Management, [employer] Free and Low Cost Healthcare, and the [facility] application for financial assistance.  Referral made to [facility] and MM.  Patient verbalized understanding of all material that will be given. RNCM contacted bedside RN and asked her to give the patient all of this information.   TOC team will cont to follow through discharge and assist with any additional needs.    Expected Discharge Plan: Home/Self Care Barriers to Discharge: Continued Medical Work up   Patient Goals and CMS Choice Patient states their goals for this hospitalization and ongoing recovery are:: Would like some resources for financial assistance   Choice offered to / list presented to : NA  Expected Discharge Plan and Services Expected Discharge Plan: Home/Self Care   Discharge Planning Services: Indigent Health Clinic, CM Consult, Medication Assistance   Living arrangements for the past 2 months: Single Family Home                   DME Agency: NA       HH Arranged: NA          Prior Living Arrangements/Services Living arrangements for the past 2 months: Single Family Home Lives with:: Significant Other Patient language and need for interpreter reviewed:: Yes Do you feel safe going back to the place  where you live?: Yes      Need for Family Participation in Patient Care: Yes (Comment) (COVID) Care giver support system in place?: Yes (comment) (Boyfriend)   Criminal Activity/Legal Involvement Pertinent to Current Situation/Hospitalization: No - Comment as needed  Activities of Daily Living Home Assistive Devices/Equipment: None ADL Screening (condition at time of admission) Patient's cognitive ability adequate to safely complete daily activities?: Yes Is the patient deaf or have difficulty hearing?: No Does the patient have difficulty seeing, even when wearing glasses/contacts?: No Does the patient have difficulty concentrating, remembering, or making decisions?: No Patient able to express need for assistance with ADLs?: Yes Does the patient have difficulty dressing or bathing?: No Independently performs ADLs?: Yes (appropriate for developmental age) Does the patient have difficulty walking or climbing stairs?: No Weakness of Legs: None Weakness of Arms/Hands: None  Permission Sought/Granted Permission sought to share information with : Case Manager, Family Supports Permission granted to share information with : Yes, Verbal Permission Granted  Share Information with NAME: Fannie Knee     Permission granted to share info w Relationship: mother     Emotional Assessment   Attitude/Demeanor/Rapport: Engaged Affect (typically observed): Accepting, Calm, Flat Orientation: : Oriented to Self, Oriented to Place, Oriented to  Time, Oriented to Situation Alcohol / Substance Use: Not Applicable Psych Involvement: No (comment)  Admission diagnosis:  Acute respiratory failure with hypoxia (HCC) [J96.01] Pneumonia  due to COVID-19 virus [U07.1, J12.82] COVID-19 [U07.1] Patient Active Problem List   Diagnosis Date Noted  . Pneumonia due to COVID-19 virus 06/29/2020  . Acute respiratory failure due to COVID-19 (HCC) 06/29/2020  . Transaminitis 06/29/2020   PCP:  Patient, No Pcp Per Pharmacy:    [id number] [address]im [address] [place] [address] Phone: [phone] Fax: [phone]     Social Determinants of Health (SDOH) Interventions    Readmission Risk Interventions No flowsheet data found.

## 2020-07-01 NOTE — Progress Notes (Signed)
PROGRESS NOTE    Brigit Doke  RXV:[id number] DOB: Oct 25, 1986 DOA: 06/29/2020 PCP: Patient, No Pcp Per     Brief Narrative:  34 y.o. WF PMHx none.   Presents to the emergency room for evaluation of chest pain mostly pleuritic associated with a refractory cough.  Patient tested positive for the COVID-19 virus on 06/21/20.  She had symptoms for 6 days prior to testing positive and was seen in the emergency room on 06/25/20 with similar complaints.  Patient at that time had pulse oximetry with exertion of 93 to 94% and was discharged home. She returns to the emergency room 4 days later from the urgent care center where she had gone for evaluation of worsening chest pain and shortness of breath.  At the urgent care she was noted to be hypoxic with room air pulse oximetry in the 80s.  Patient was placed on a nonrebreather mask with improvement in her pulse oximetry to 97%.  She also received a dose of IM Solu-Medrol. She is unvaccinated Labs show VBG 7.46/46/32/17.3, sodium 137, potassium 4, chloride 100, bicarb 25, BUN 7, creatinine 0.70, magnesium 2.0, AST 140, ALT 102, BNP 30, white count 5.8, hemoglobin 13.8, hematocrit 41.2 Chest x-ray reviewed by me shows marked worsening of widespread patchy bilateral pulmonary infiltrate when compared to prior x-ray. Twelve-lead EKG reviewed by me shows sinus rhythm   ED Course: Patient is a 34 year old female who presents to the ER for evaluation of worsening pleuritic chest pain and shortness of breath.  Patient tested positive for the COVID-19 virus on 06/21/20 but had symptoms 6 days prior to testing positive.  She was seen at urgent care and referred to the ER for hypoxia.  Room air pulse oximetry was in the 80s and improved following oxygen supplementation.  She received a dose of IM Solu-Medrol at the urgent care center.  She received remdesivir in the ER and will be admitted to the hospital for further evaluation.   Subjective: 9/5 afebrile  overnight A/O x4, positive S OB, negative CP.  Positive fatigue   Assessment & Plan: Covid vaccination; no vaccination   Principal Problem:   Pneumonia due to COVID-19 virus Active Problems:   Acute respiratory failure due to COVID-19 [facility])   Transaminitis  Acute respiratory failure with hypoxia/Covid pneumonia  COVID-19 Labs  Recent Labs    06/30/20 0631 06/30/20 1058 07/01/20 0638  FERRITIN 731* 552* 408*  LDH  --  570* 509*  CRP 4.2* 3.2*  --     No results found for: SARSCOV2NAA  -Remdesivir per pharmacy protocol -Solu-Medrol 40 mg BID -Baricitinib 4 mg x 14 days -Combivent QID -Vitamin C and zinc per Covid protocol      DVT prophylaxis: Lovenox Code Status: Full Family Communication:  Status is: Inpatient    Dispo: The patient is from: Home              Anticipated d/c is to: Home              Anticipated d/c date is: 9/11              Patient currently unstable      Consultants:    Procedures/Significant Events:    I have personally reviewed and interpreted all radiology studies and my findings are as above.  VENTILATOR SETTINGS: HFNC 9/5 Flow; 6 L/min SPO2; 97%   Cultures 8/26 SARS coronavirus positive   Antimicrobials: Anti-infectives (From admission, onward)   Start     Ordered Stop  06/30/20 1000  remdesivir 100 mg in sodium chloride 0.9 % 100 mL IVPB       "Followed by" Linked Group Details   06/29/20 1109 07/04/20 0959   06/29/20 1230  remdesivir 200 mg in sodium chloride 0.9% 250 mL IVPB       "Followed by" Linked Group Details   06/29/20 1109 06/29/20 1338       Devices    LINES / TUBES:      Continuous Infusions: . sodium chloride Stopped (06/30/20 1118)  . sodium chloride    . remdesivir 100 mg in NS 100 mL 100 mg (07/01/20 0801)     Objective: Vitals:   06/30/20 2042 07/01/20 0028 07/01/20 0519 07/01/20 0747  BP: 138/90 (!) 137/93 126/90 106/81  Pulse: 81 67 70 70  Resp: 16 16 16 20   Temp: 98.5  F (36.9 C) 98.1 F (36.7 C) 98.1 F (36.7 C) 98.2 F (36.8 C)  TempSrc: Oral Oral Oral Oral  SpO2: 98% 95% 93% 99%  Weight:      Height:        Intake/Output Summary (Last 24 hours) at 07/01/2020 08/31/2020 Last data filed at 06/30/2020 1600 Gross per 24 hour  Intake 102.79 ml  Output --  Net 102.79 ml   Filed Weights   06/29/20 1007  Weight: 76.7 kg    Examination:  General: A/O x4, positive acute respiratory distress Eyes: negative scleral hemorrhage, negative anisocoria, negative icterus ENT: Negative Runny nose, negative gingival bleeding, Neck:  Negative scars, masses, torticollis, lymphadenopathy, JVD Lungs: tachypneic decreased breath sounds bilaterally without wheezes, positiver bibasilar crackles Cardiovascular: Regular rate and rhythm without murmur gallop or rub normal S1 and S2 Abdomen: negative abdominal pain, nondistended, positive soft, bowel sounds, no rebound, no ascites, no appreciable mass Extremities: No significant cyanosis, clubbing, or edema bilateral lower extremities Skin: Negative rashes, lesions, ulcers Psychiatric:  Negative depression, negative anxiety, negative fatigue, negative mania  Central nervous system:  Cranial nerves II through XII intact, tongue/uvula midline, all extremities muscle strength 5/5, sensation intact throughout, negative dysarthria, negative expressive aphasia, negative receptive aphasia.  .     Data Reviewed: Care during the described time interval was provided by me .  I have reviewed this patient's available data, including medical history, events of note, physical examination, and all test results as part of my evaluation.  CBC: Recent Labs  Lab 06/25/20 0611 06/29/20 1006 06/30/20 0631 06/30/20 1058 07/01/20 0638  WBC 4.5 5.8 5.3 8.4 9.1  NEUTROABS  --  4.2 4.2 6.2 7.3  HGB 13.1 13.8 12.6 11.7* 12.3  HCT 39.7 41.2 38.0 35.0* 36.9  MCV 93.0 90.9 92.0 91.1 92.0  PLT 102* 158 203 233 297   Basic Metabolic  Panel: Recent Labs  Lab 06/25/20 0611 06/29/20 1006 06/30/20 0631 06/30/20 1058 07/01/20 0638  NA 137 137 138 136 139  K 3.6 4.0 4.1 3.9 4.3  CL 102 100 103 105 103  CO2 29 25 25 25 27   GLUCOSE 93 102* 167* 128* 122*  BUN 8 7 10 9 14   CREATININE 0.64 0.70 0.62 0.51 0.56  CALCIUM 8.7* 8.6* 8.9 9.0 8.7*  MG  --  2.0 2.2 1.9 2.2  PHOS  --   --  3.4 2.9 3.4   GFR: Estimated Creatinine Clearance: 100.6 mL/min (by C-G formula based on SCr of 0.56 mg/dL). Liver Function Tests: Recent Labs  Lab 06/25/20 0611 06/29/20 1006 06/30/20 0631 06/30/20 1058 07/01/20 0638  AST 63* 140* 92* 86*  71*  ALT 55* 102* 93* 89* 87*  ALKPHOS 47 45 48 47 43  BILITOT 0.4 1.0 0.4 0.5 0.5  PROT 7.8 7.3 7.3 6.7 6.8  ALBUMIN 4.3 3.8 3.7 3.3* 3.4*   No results for input(s): LIPASE, AMYLASE in the last 168 hours. No results for input(s): AMMONIA in the last 168 hours. Coagulation Profile: No results for input(s): INR, PROTIME in the last 168 hours. Cardiac Enzymes: No results for input(s): CKTOTAL, CKMB, CKMBINDEX, TROPONINI in the last 168 hours. BNP (last 3 results) No results for input(s): PROBNP in the last 8760 hours. HbA1C: No results for input(s): HGBA1C in the last 72 hours. CBG: No results for input(s): GLUCAP in the last 168 hours. Lipid Profile: No results for input(s): CHOL, HDL, LDLCALC, TRIG, CHOLHDL, LDLDIRECT in the last 72 hours. Thyroid Function Tests: No results for input(s): TSH, T4TOTAL, FREET4, T3FREE, THYROIDAB in the last 72 hours. Anemia Panel: Recent Labs    06/30/20 1058 07/01/20 0638  FERRITIN 552* 408*   Sepsis Labs: Recent Labs  Lab 06/25/20 0611  LATICACIDVEN 0.8    No results found for this or any previous visit (from the past 240 hour(s)).       Radiology Studies: DG Chest 1 View  Result Date: 06/29/2020 CLINICAL DATA:  Hypoxia.  Coronavirus infection. EXAM: CHEST  1 VIEW COMPARISON:  06/25/2020 FINDINGS: Marked worsening of widespread patchy  bilateral pulmonary infiltrates. No lobar consolidation or collapse. No visible effusion. IMPRESSION: Marked worsening of widespread patchy bilateral pulmonary infiltrates. Electronically Signed   By: Paulina Fusi M.D.   On: 06/29/2020 10:39   CT Angio Chest PE W and/or Wo Contrast  Result Date: 06/29/2020 CLINICAL DATA:  Chest pain, COVID positive EXAM: CT ANGIOGRAPHY CHEST WITH CONTRAST TECHNIQUE: Multidetector CT imaging of the chest was performed using the standard protocol during bolus administration of intravenous contrast. Multiplanar CT image reconstructions and MIPs were obtained to evaluate the vascular anatomy. CONTRAST:  OMNIPAQUE IOHEXOL 350 MG/ML SOLN COMPARISON:  None. FINDINGS: Cardiovascular: Satisfactory opacification of the pulmonary arteries to the segmental level. No evidence of pulmonary embolism. Normal heart size. No pericardial effusion. Thoracic aorta is normal in caliber. 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 Mediastinum/Nodes: Small, likely reactive lymph nodes. Visualized thyroid is unremarkable. Lungs/Pleura: Diffuse bilateral consolidative and ground-glass opacities with relative apical sparing. No pleural effusion. No pneumothorax. Upper Abdomen: No acute abnormality. Musculoskeletal: No acute osseous abnormality. Review of the MIP images confirms the above findings. IMPRESSION: No evidence of acute pulmonary embolism. Diffuse bilateral consolidative and ground-glass opacities likely reflecting COVID-19 pneumonia. Electronically Signed   By: Guadlupe Spanish M.D.   On: 06/29/2020 14:32        Scheduled Meds: . vitamin C  500 mg Oral Daily  . baricitinib  4 mg Oral Daily  . enoxaparin (LOVENOX) injection  40 mg Subcutaneous Q24H  . Ipratropium-Albuterol  1 puff Inhalation QID  . methylPREDNISolone (SOLU-MEDROL) injection  40 mg Intravenous Q12H  . sodium chloride flush  3 mL Intravenous Q12H  . zinc sulfate  220 mg Oral Daily    Continuous Infusions: . sodium chloride Stopped (06/30/20 1118)  . sodium chloride    . remdesivir 100 mg in NS 100 mL 100 mg (07/01/20 0801)     LOS: 2 days    Time spent:40 min    Calene Paradiso, Roselind Messier, MD [facility] Pager [phone]  If 7PM-7AM, please contact night-coverage [email or web address] Password [email or web address] 07/01/2020, 9:04 AM

## 2020-07-01 NOTE — Plan of Care (Signed)

## 2020-07-01 NOTE — Progress Notes (Signed)
Patient refused scheduled pm Lovenox. Patient educated on the need for blood thinners.

## 2020-07-02 LAB — COMPREHENSIVE METABOLIC PANEL
ALT: 80 U/L — ABNORMAL HIGH (ref 0–44)
AST: 51 U/L — ABNORMAL HIGH (ref 15–41)
Albumin: 3.4 g/dL — ABNORMAL LOW (ref 3.5–5.0)
Alkaline Phosphatase: 46 U/L (ref 38–126)
Anion gap: 9 (ref 5–15)
BUN: 18 mg/dL (ref 6–20)
CO2: 27 mmol/L (ref 22–32)
Calcium: 8.7 mg/dL — ABNORMAL LOW (ref 8.9–10.3)
Chloride: 102 mmol/L (ref 98–111)
Creatinine, Ser: 0.65 mg/dL (ref 0.44–1.00)
GFR calc Af Amer: >60 mL/min (ref >60)
GFR calc non Af Amer: >60 mL/min (ref >60)
Glucose, Bld: 128 mg/dL — ABNORMAL HIGH (ref 70–99)
Potassium: 4.7 mmol/L (ref 3.5–5.1)
Sodium: 138 mmol/L (ref 135–145)
Total Bilirubin: 0.5 mg/dL (ref 0.3–1.2)
Total Protein: 6.8 g/dL (ref 6.5–8.1)

## 2020-07-02 LAB — CBC WITH DIFFERENTIAL/PLATELET
Abs Immature Granulocytes: 0.56 10*3/uL — ABNORMAL HIGH (ref 0.00–0.07)
Basophils Absolute: 0.1 10*3/uL (ref 0.0–0.1)
Basophils Relative: 1 %
Eosinophils Absolute: 0 10*3/uL (ref 0.0–0.5)
Eosinophils Relative: 0 %
HCT: 37.8 % (ref 36.0–46.0)
Hemoglobin: 12.7 g/dL (ref 12.0–15.0)
Immature Granulocytes: 5 %
Lymphocytes Relative: 8 %
Lymphs Abs: 0.9 10*3/uL (ref 0.7–4.0)
MCH: 30.4 pg (ref 26.0–34.0)
MCHC: 33.6 g/dL (ref 30.0–36.0)
MCV: 90.4 fL (ref 80.0–100.0)
Monocytes Absolute: 1.2 10*3/uL — ABNORMAL HIGH (ref 0.1–1.0)
Monocytes Relative: 10 %
Neutro Abs: 9.2 10*3/uL — ABNORMAL HIGH (ref 1.7–7.7)
Neutrophils Relative %: 76 %
Platelets: 341 10*3/uL (ref 150–400)
RBC: 4.18 MIL/uL (ref 3.87–5.11)
RDW: 12.6 % (ref 11.5–15.5)
WBC: 11.9 10*3/uL — ABNORMAL HIGH (ref 4.0–10.5)
nRBC: 0 % (ref 0.0–0.2)

## 2020-07-02 LAB — PHOSPHORUS: Phosphorus: 3.5 mg/dL (ref 2.5–4.6)

## 2020-07-02 LAB — LACTATE DEHYDROGENASE: LDH: 436 U/L — ABNORMAL HIGH (ref 98–192)

## 2020-07-02 LAB — FERRITIN: Ferritin: 294 ng/mL (ref 11–307)

## 2020-07-02 LAB — C-REACTIVE PROTEIN: CRP: 1 mg/dL — ABNORMAL HIGH (ref <1.0)

## 2020-07-02 LAB — MAGNESIUM: Magnesium: 2.1 mg/dL (ref 1.7–2.4)

## 2020-07-02 LAB — FIBRIN DERIVATIVES D-DIMER (ARMC ONLY): Fibrin derivatives D-dimer (ARMC): 2754.29 ng/mL (FEU) — ABNORMAL HIGH (ref 0.00–499.00)

## 2020-07-02 NOTE — Progress Notes (Signed)
PROGRESS NOTE    Dawn Ware  IWP:[id number] DOB: 1986-01-22 DOA: 06/29/2020 PCP: Patient, No Pcp Per     Brief Narrative:  34 y.o. WF PMHx none.   Presents to the emergency room for evaluation of chest pain mostly pleuritic associated with a refractory cough.  Patient tested positive for the COVID-19 virus on 06/21/20.  She had symptoms for 6 days prior to testing positive and was seen in the emergency room on 06/25/20 with similar complaints.  Patient at that time had pulse oximetry with exertion of 93 to 94% and was discharged home. She returns to the emergency room 4 days later from the urgent care center where she had gone for evaluation of worsening chest pain and shortness of breath.  At the urgent care she was noted to be hypoxic with room air pulse oximetry in the 80s.  Patient was placed on a nonrebreather mask with improvement in her pulse oximetry to 97%.  She also received a dose of IM Solu-Medrol. She is unvaccinated Labs show VBG 7.46/46/32/17.3, sodium 137, potassium 4, chloride 100, bicarb 25, BUN 7, creatinine 0.70, magnesium 2.0, AST 140, ALT 102, BNP 30, white count 5.8, hemoglobin 13.8, hematocrit 41.2 Chest x-ray reviewed by me shows marked worsening of widespread patchy bilateral pulmonary infiltrate when compared to prior x-ray. Twelve-lead EKG reviewed by me shows sinus rhythm   ED Course: Patient is a 34 year old female who presents to the ER for evaluation of worsening pleuritic chest pain and shortness of breath.  Patient tested positive for the COVID-19 virus on 06/21/20 but had symptoms 6 days prior to testing positive.  She was seen at urgent care and referred to the ER for hypoxia.  Room air pulse oximetry was in the 80s and improved following oxygen supplementation.  She received a dose of IM Solu-Medrol at the urgent care center.  She received remdesivir in the ER and will be admitted to the hospital for further evaluation.   Subjective: 9/6 A/O x4,  positive S OB, positive fatigue.  Negative CP frustrated that she is not improving, has 3 children at home, all of them mildly ill.   Assessment & Plan: Covid vaccination; no vaccination   Principal Problem:   Pneumonia due to COVID-19 virus Active Problems:   Acute respiratory failure due to COVID-19 [facility])   Transaminitis  Acute respiratory failure with hypoxia/Covid pneumonia  COVID-19 Labs  Recent Labs    06/30/20 0631 06/30/20 0631 06/30/20 1058 07/01/20 0638 07/02/20 0505  FERRITIN 731*   < > 552* 408* 294  LDH  --   --  570* 509* 436*  CRP 4.2*  --  3.2* 1.4*  --    < > = values in this interval not displayed.    No results found for: SARSCOV2NAA  -Remdesivir per pharmacy protocol -Solu-Medrol 40 mg BID -Baricitinib 4 mg x 14 days -Combivent QID -Vitamin C and zinc per Covid protocol -Flutter valve -Incentive spirometry      DVT prophylaxis: Lovenox Code Status: Full Family Communication:  Status is: Inpatient    Dispo: The patient is from: Home              Anticipated d/c is to: Home              Anticipated d/c date is: 9/11              Patient currently unstable      Consultants:    Procedures/Significant Events:    I have personally  reviewed and interpreted all radiology studies and my findings are as above.  VENTILATOR SETTINGS: Nasal cannula 9/6 Flow; 7 L/min SPO2; 91%   Cultures 8/26 SARS coronavirus positive   Antimicrobials: Anti-infectives (From admission, onward)   Start     Ordered Stop   06/30/20 1000  remdesivir 100 mg in sodium chloride 0.9 % 100 mL IVPB       "Followed by" Linked Group Details   06/29/20 1109 07/04/20 0959   06/29/20 1230  remdesivir 200 mg in sodium chloride 0.9% 250 mL IVPB       "Followed by" Linked Group Details   06/29/20 1109 06/29/20 1338       Devices    LINES / TUBES:      Continuous Infusions: . sodium chloride Stopped (06/30/20 1118)  . sodium chloride    .  remdesivir 100 mg in NS 100 mL 100 mg (07/01/20 0801)     Objective: Vitals:   07/01/20 1945 07/02/20 0026 07/02/20 0448 07/02/20 0758  BP: 125/90 126/90 (!) 141/85 120/85  Pulse: 71 70 (!) 57 68  Resp: 20 18 18 18   Temp: 98.3 F (36.8 C) 98.1 F (36.7 C) 98.1 F (36.7 C) 97.9 F (36.6 C)  TempSrc:  Oral Oral Oral  SpO2: 93% 94% 99% 95%  Weight:      Height:       No intake or output data in the 24 hours ending 07/02/20 0932 Filed Weights   06/29/20 1007  Weight: 76.7 kg    Examination:  General: A/O x4, positive acute respiratory distress Eyes: negative scleral hemorrhage, negative anisocoria, negative icterus ENT: Negative Runny nose, negative gingival bleeding, Neck:  Negative scars, masses, torticollis, lymphadenopathy, JVD Lungs: tachypneic decreased breath sounds bilaterally without wheezes, positiver bibasilar crackles Cardiovascular: Regular rate and rhythm without murmur gallop or rub normal S1 and S2 Abdomen: negative abdominal pain, nondistended, positive soft, bowel sounds, no rebound, no ascites, no appreciable mass Extremities: No significant cyanosis, clubbing, or edema bilateral lower extremities Skin: Negative rashes, lesions, ulcers Psychiatric:  Negative depression, negative anxiety, negative fatigue, negative mania  Central nervous system:  Cranial nerves II through XII intact, tongue/uvula midline, all extremities muscle strength 5/5, sensation intact throughout, negative dysarthria, negative expressive aphasia, negative receptive aphasia.  .     Data Reviewed: Care during the described time interval was provided by me .  I have reviewed this patient's available data, including medical history, events of note, physical examination, and all test results as part of my evaluation.  CBC: Recent Labs  Lab 06/29/20 1006 06/30/20 0631 06/30/20 1058 07/01/20 0638 07/02/20 0505  WBC 5.8 5.3 8.4 9.1 11.9*  NEUTROABS 4.2 4.2 6.2 7.3 9.2*  HGB 13.8  12.6 11.7* 12.3 12.7  HCT 41.2 38.0 35.0* 36.9 37.8  MCV 90.9 92.0 91.1 92.0 90.4  PLT 158 203 233 297 341   Basic Metabolic Panel: Recent Labs  Lab 06/29/20 1006 06/30/20 0631 06/30/20 1058 07/01/20 0638 07/02/20 0505  NA 137 138 136 139 138  K 4.0 4.1 3.9 4.3 4.7  CL 100 103 105 103 102  CO2 25 25 25 27 27   GLUCOSE 102* 167* 128* 122* 128*  BUN 7 10 9 14 18   CREATININE 0.70 0.62 0.51 0.56 0.65  CALCIUM 8.6* 8.9 9.0 8.7* 8.7*  MG 2.0 2.2 1.9 2.2 2.1  PHOS  --  3.4 2.9 3.4 3.5   GFR: Estimated Creatinine Clearance: 100.6 mL/min (by C-G formula based on SCr of 0.65 mg/dL).  Liver Function Tests: Recent Labs  Lab 06/29/20 1006 06/30/20 0631 06/30/20 1058 07/01/20 0638 07/02/20 0505  AST 140* 92* 86* 71* 51*  ALT 102* 93* 89* 87* 80*  ALKPHOS 45 48 47 43 46  BILITOT 1.0 0.4 0.5 0.5 0.5  PROT 7.3 7.3 6.7 6.8 6.8  ALBUMIN 3.8 3.7 3.3* 3.4* 3.4*   No results for input(s): LIPASE, AMYLASE in the last 168 hours. No results for input(s): AMMONIA in the last 168 hours. Coagulation Profile: No results for input(s): INR, PROTIME in the last 168 hours. Cardiac Enzymes: No results for input(s): CKTOTAL, CKMB, CKMBINDEX, TROPONINI in the last 168 hours. BNP (last 3 results) No results for input(s): PROBNP in the last 8760 hours. HbA1C: No results for input(s): HGBA1C in the last 72 hours. CBG: No results for input(s): GLUCAP in the last 168 hours. Lipid Profile: No results for input(s): CHOL, HDL, LDLCALC, TRIG, CHOLHDL, LDLDIRECT in the last 72 hours. Thyroid Function Tests: No results for input(s): TSH, T4TOTAL, FREET4, T3FREE, THYROIDAB in the last 72 hours. Anemia Panel: Recent Labs    07/01/20 0638 07/02/20 0505  FERRITIN 408* 294   Sepsis Labs: No results for input(s): PROCALCITON, LATICACIDVEN in the last 168 hours.  No results found for this or any previous visit (from the past 240 hour(s)).       Radiology Studies: No results  found.      Scheduled Meds: . vitamin C  500 mg Oral Daily  . baricitinib  4 mg Oral Daily  . enoxaparin (LOVENOX) injection  40 mg Subcutaneous Q24H  . Ipratropium-Albuterol  1 puff Inhalation QID  . methylPREDNISolone (SOLU-MEDROL) injection  40 mg Intravenous Q12H  . sodium chloride flush  3 mL Intravenous Q12H  . zinc sulfate  220 mg Oral Daily   Continuous Infusions: . sodium chloride Stopped (06/30/20 1118)  . sodium chloride    . remdesivir 100 mg in NS 100 mL 100 mg (07/01/20 0801)     LOS: 3 days    Time spent:40 min    Dawn Ware, Roselind Messier, MD [facility] Pager [phone]  If 7PM-7AM, please contact night-coverage [email or web address] Password [facility] 07/02/2020, 9:32 AM

## 2020-07-02 NOTE — Progress Notes (Signed)
Uneventful day. nsr on moniter. Up and about in room and br with no resp difficulty.  remdesivir day 4.

## 2020-07-03 DIAGNOSIS — J9601 Acute respiratory failure with hypoxia: Secondary | ICD-10-CM

## 2020-07-03 DIAGNOSIS — J9600 Acute respiratory failure, unspecified whether with hypoxia or hypercapnia: Secondary | ICD-10-CM

## 2020-07-03 DIAGNOSIS — R7401 Elevation of levels of liver transaminase levels: Secondary | ICD-10-CM

## 2020-07-03 LAB — CBC WITH DIFFERENTIAL/PLATELET
Abs Immature Granulocytes: 0.81 10*3/uL — ABNORMAL HIGH (ref 0.00–0.07)
Basophils Absolute: 0.1 10*3/uL (ref 0.0–0.1)
Basophils Relative: 1 %
Eosinophils Absolute: 0 10*3/uL (ref 0.0–0.5)
Eosinophils Relative: 0 %
HCT: 36.3 % (ref 36.0–46.0)
Hemoglobin: 12.2 g/dL (ref 12.0–15.0)
Immature Granulocytes: 5 %
Lymphocytes Relative: 12 %
Lymphs Abs: 1.8 10*3/uL (ref 0.7–4.0)
MCH: 30.8 pg (ref 26.0–34.0)
MCHC: 33.6 g/dL (ref 30.0–36.0)
MCV: 91.7 fL (ref 80.0–100.0)
Monocytes Absolute: 1 10*3/uL (ref 0.1–1.0)
Monocytes Relative: 7 %
Neutro Abs: 11.7 10*3/uL — ABNORMAL HIGH (ref 1.7–7.7)
Neutrophils Relative %: 75 %
Platelets: 335 10*3/uL (ref 150–400)
RBC: 3.96 MIL/uL (ref 3.87–5.11)
RDW: 12.4 % (ref 11.5–15.5)
WBC: 15.4 10*3/uL — ABNORMAL HIGH (ref 4.0–10.5)
nRBC: 0 % (ref 0.0–0.2)

## 2020-07-03 LAB — FERRITIN: Ferritin: 202 ng/mL (ref 11–307)

## 2020-07-03 LAB — COMPREHENSIVE METABOLIC PANEL
ALT: 67 U/L — ABNORMAL HIGH (ref 0–44)
AST: 44 U/L — ABNORMAL HIGH (ref 15–41)
Albumin: 3.2 g/dL — ABNORMAL LOW (ref 3.5–5.0)
Alkaline Phosphatase: 44 U/L (ref 38–126)
Anion gap: 8 (ref 5–15)
BUN: 19 mg/dL (ref 6–20)
CO2: 29 mmol/L (ref 22–32)
Calcium: 8.5 mg/dL — ABNORMAL LOW (ref 8.9–10.3)
Chloride: 99 mmol/L (ref 98–111)
Creatinine, Ser: 0.67 mg/dL (ref 0.44–1.00)
GFR calc Af Amer: >60 mL/min (ref >60)
GFR calc non Af Amer: >60 mL/min (ref >60)
Glucose, Bld: 96 mg/dL (ref 70–99)
Potassium: 3.7 mmol/L (ref 3.5–5.1)
Sodium: 136 mmol/L (ref 135–145)
Total Bilirubin: 0.6 mg/dL (ref 0.3–1.2)
Total Protein: 6.3 g/dL — ABNORMAL LOW (ref 6.5–8.1)

## 2020-07-03 LAB — PHOSPHORUS: Phosphorus: 3 mg/dL (ref 2.5–4.6)

## 2020-07-03 LAB — C-REACTIVE PROTEIN: CRP: 1.3 mg/dL — ABNORMAL HIGH (ref <1.0)

## 2020-07-03 LAB — MAGNESIUM: Magnesium: 2 mg/dL (ref 1.7–2.4)

## 2020-07-03 LAB — FIBRIN DERIVATIVES D-DIMER (ARMC ONLY): Fibrin derivatives D-dimer (ARMC): 2710.96 ng/mL (FEU) — ABNORMAL HIGH (ref 0.00–499.00)

## 2020-07-03 LAB — LACTATE DEHYDROGENASE: LDH: 392 U/L — ABNORMAL HIGH (ref 98–192)

## 2020-07-03 NOTE — Progress Notes (Signed)
SATURATION QUALIFICATIONS: (This note is used to comply with regulatory documentation for home oxygen)  Patient Saturations on Room Air at Rest = 92%  Patient Saturations on Room Air while Ambulating =86%  Patient Saturations on2 Liters of oxygen while Ambulating =94%  Please briefly explain why patient needs home oxygen:sats drop  While amb on r/a

## 2020-07-03 NOTE — Consult Note (Addendum)
ANTICOAGULATION CONSULT NOTE  Pharmacy Consult for Enoxaparin Indication: VTE prophylaxis  Patient Measurements: Height: 5\' 5"  (165.1 cm) Weight: 76.7 kg (169 lb) IBW/kg (Calculated) : 57  Labs: Recent Labs    07/01/20 0638 07/01/20 0638 07/02/20 0505 07/03/20 0730  HGB 12.3   < > 12.7 12.2  HCT 36.9  --  37.8 36.3  PLT 297  --  341 335  CREATININE 0.56  --  0.65 0.67   < > = values in this interval not displayed.    Estimated Creatinine Clearance: 100.6 mL/min (by C-G formula based on SCr of 0.67 mg/dL).  Assessment: Patient is a 34 y/o F who is admitted with COVID-19 pneumonia. 9/3 CTA negative for PE. D-dimer is trending down. Pharmacy consulted for enoxaparin dosing for VTE prophylaxis.   Plan:  --Based on patient parameters, will continue with standard prophylactic intensity enoxaparin dosing of 40 mg q24h -- Pharmacy will sign-off on consult for VTE prophylaxis dosing --Scr at least weekly per protocol  Dawn Ware R Dawn Ware 07/03/2020,9:23 AM

## 2020-07-03 NOTE — Progress Notes (Signed)
Ambulatory SPO2

## 2020-07-03 NOTE — Plan of Care (Signed)
  Problem: Education: Goal: Knowledge of General Education information will improve Description: Including pain rating scale, medication(s)/side effects and non-pharmacologic comfort measures Outcome: Progressing   Problem: Health Behavior/Discharge Planning: Goal: Ability to manage health-related needs will improve Outcome: Progressing   Problem: Clinical Measurements: Goal: Ability to maintain clinical measurements within normal limits will improve Outcome: Progressing Goal: Will remain free from infection Outcome: Progressing Goal: Diagnostic test results will improve Outcome: Progressing Goal: Respiratory complications will improve Outcome: Progressing Note: Amb in hall today sats dropped  to 86 % on 2 l Brady 02 now  [employer]  spiro  Goal: Cardiovascular complication will be avoided Outcome: Progressing   Problem: Activity: Goal: Risk for activity intolerance will decrease Outcome: Progressing   Problem: Nutrition: Goal: Adequate nutrition will be maintained Outcome: Progressing   Problem: Coping: Goal: Level of anxiety will decrease Outcome: Progressing   Problem: Elimination: Goal: Will not experience complications related to bowel motility Outcome: Progressing Note: Bm this am Goal: Will not experience complications related to urinary retention Outcome: Progressing   Problem: Pain Managment: Goal: General experience of comfort will improve Outcome: Progressing   Problem: Safety: Goal: Ability to remain free from injury will improve Outcome: Progressing   Problem: Skin Integrity: Goal: Risk for impaired skin integrity will decrease Outcome: Progressing

## 2020-07-03 NOTE — Progress Notes (Addendum)
PROGRESS NOTE    Dawn Ware  WHQ:[id number] DOB: [birth year] DOA: 06/29/2020 PCP: Patient, No Pcp Per     Brief Narrative:  34 y.o. WF PMHx none.   Presents to the emergency room for evaluation of chest pain mostly pleuritic associated with a refractory cough.  Patient tested positive for the COVID-19 virus on 06/21/20.  She had symptoms for 6 days prior to testing positive and was seen in the emergency room on 06/25/20 with similar complaints.  Patient at that time had pulse oximetry with exertion of 93 to 94% and was discharged home. She returns to the emergency room 4 days later from the urgent care center where she had gone for evaluation of worsening chest pain and shortness of breath.  At the urgent care she was noted to be hypoxic with room air pulse oximetry in the 80s.  Patient was placed on a nonrebreather mask with improvement in her pulse oximetry to 97%.  She also received a dose of IM Solu-Medrol. She is unvaccinated Labs show VBG 7.46/46/32/17.3, sodium 137, potassium 4, chloride 100, bicarb 25, BUN 7, creatinine 0.70, magnesium 2.0, AST 140, ALT 102, BNP 30, white count 5.8, hemoglobin 13.8, hematocrit 41.2 Chest x-ray reviewed by me shows marked worsening of widespread patchy bilateral pulmonary infiltrate when compared to prior x-ray. Twelve-lead EKG reviewed by me shows sinus rhythm   ED Course: Patient is a 34 year old female who presents to the ER for evaluation of worsening pleuritic chest pain and shortness of breath.  Patient tested positive for the COVID-19 virus on 06/21/20 but had symptoms 6 days prior to testing positive.  She was seen at urgent care and referred to the ER for hypoxia.  Room air pulse oximetry was in the 80s and improved following oxygen supplementation.  She received a dose of IM Solu-Medrol at the urgent care center.  She received remdesivir in the ER and will be admitted to the hospital for further evaluation.   Subjective: 9/7 A/O x4,  positive S OB, positive continued fatigue but looks slightly improved.  Negative CP.  Negative nausea, negative vomiting.   Assessment & Plan: Covid vaccination; no vaccination   Principal Problem:   Pneumonia due to COVID-19 virus Active Problems:   Acute respiratory failure due to COVID-19 [facility])   Transaminitis  Acute respiratory failure with hypoxia/Covid pneumonia  COVID-19 Labs  Recent Labs    06/30/20 1058 06/30/20 1058 07/01/20 0638 07/02/20 0505 07/03/20 0730  FERRITIN 552*   < > 408* 294 202  LDH 570*   < > 509* 436* 392*  CRP 3.2*  --  1.4* 1.0*  --    < > = values in this interval not displayed.    No results found for: SARSCOV2NAA  -Remdesivir per pharmacy protocol -Solu-Medrol 40 mg BID -Baricitinib 4 mg x 14 days -Combivent QID -Vitamin C and zinc per Covid protocol -Flutter valve -Incentive spirometry SATURATION QUALIFICATIONS: (This note is used to comply with regulatory documentation for home oxygen) Patient Saturations on Room Air at Rest = 92% Patient Saturations on Room Air while Ambulating =86% Patient Saturations on2 Liters of oxygen while Ambulating =94% Please briefly explain why patient needs home oxygen:sats drop  While amb on r/a -Patient qualifies for home O2 -2 L O2; titrate to maintain SPO2 > 8% -Provide In[employer] home O2 portable concentrator     DVT prophylaxis: Lovenox Code Status: Full Family Communication: 9/7 attempted to contact Fannie Knee (mother) no one answered the phone left message that I had  called to give update. Status is: Inpatient    Dispo: The patient is from: Home              Anticipated d/c is to: Home              Anticipated d/c date is: 9/11              Patient currently unstable      Consultants:    Procedures/Significant Events:    I have personally reviewed and interpreted all radiology studies and my findings are as above.  VENTILATOR SETTINGS: Nasal cannula 9/7 Flow; 3 L/min SPO2;  97%   Cultures 8/26 SARS coronavirus positive   Antimicrobials: Anti-infectives (From admission, onward)   Start     Ordered Stop   06/30/20 1000  remdesivir 100 mg in sodium chloride 0.9 % 100 mL IVPB       "Followed by" Linked Group Details   06/29/20 1109 07/04/20 0959   06/29/20 1230  remdesivir 200 mg in sodium chloride 0.9% 250 mL IVPB       "Followed by" Linked Group Details   06/29/20 1109 06/29/20 1338       Devices    LINES / TUBES:      Continuous Infusions: . sodium chloride Stopped (06/30/20 1118)  . sodium chloride    . remdesivir 100 mg in NS 100 mL 100 mg (07/02/20 1021)     Objective: Vitals:   07/02/20 2353 07/03/20 0131 07/03/20 0502 07/03/20 0756  BP: 127/88  128/85 117/74  Pulse: (!) 58  61 63  Resp: 16  17 18   Temp: 98.4 F (36.9 C)  97.9 F (36.6 C) 98.1 F (36.7 C)  TempSrc:   Oral Oral  SpO2: 100% 97% 97% 99%  Weight:      Height:       No intake or output data in the 24 hours ending 07/03/20 0931 Filed Weights   06/29/20 1007  Weight: 76.7 kg   Physical Exam:  General: A/O x4, positive acute respiratory distress Eyes: negative scleral hemorrhage, negative anisocoria, negative icterus ENT: Negative Runny nose, negative gingival bleeding, Neck:  Negative scars, masses, torticollis, lymphadenopathy, JVD Lungs: decreased breath sounds but improved from 9/6 bilaterally without wheezes or crackles Cardiovascular: Regular rate and rhythm without murmur gallop or rub normal S1 and S2 Abdomen: negative abdominal pain, nondistended, positive soft, bowel sounds, no rebound, no ascites, no appreciable mass Extremities: No significant cyanosis, clubbing, or edema bilateral lower extremities Skin: Negative rashes, lesions, ulcers Psychiatric:  Negative depression, negative anxiety, negative fatigue, negative mania  Central nervous system:  Cranial nerves II through XII intact, tongue/uvula midline, all extremities muscle strength 5/5,  sensation intact throughout, negative dysarthria, negative expressive aphasia, negative receptive aphasia.  .     Data Reviewed: Care during the described time interval was provided by me .  I have reviewed this patient's available data, including medical history, events of note, physical examination, and all test results as part of my evaluation.  CBC: Recent Labs  Lab 06/30/20 0631 06/30/20 1058 07/01/20 0638 07/02/20 0505 07/03/20 0730  WBC 5.3 8.4 9.1 11.9* 15.4*  NEUTROABS 4.2 6.2 7.3 9.2* 11.7*  HGB 12.6 11.7* 12.3 12.7 12.2  HCT 38.0 35.0* 36.9 37.8 36.3  MCV 92.0 91.1 92.0 90.4 91.7  PLT 203 233 297 341 335   Basic Metabolic Panel: Recent Labs  Lab 06/30/20 0631 06/30/20 1058 07/01/20 0638 07/02/20 0505 07/03/20 0730  NA 138 136 139 138  136  K 4.1 3.9 4.3 4.7 3.7  CL 103 105 103 102 99  CO2 25 25 27 27 29   GLUCOSE 167* 128* 122* 128* 96  BUN 10 9 14 18 19   CREATININE 0.62 0.51 0.56 0.65 0.67  CALCIUM 8.9 9.0 8.7* 8.7* 8.5*  MG 2.2 1.9 2.2 2.1 2.0  PHOS 3.4 2.9 3.4 3.5 3.0   GFR: Estimated Creatinine Clearance: 100.6 mL/min (by C-G formula based on SCr of 0.67 mg/dL). Liver Function Tests: Recent Labs  Lab 06/30/20 0631 06/30/20 1058 07/01/20 0638 07/02/20 0505 07/03/20 0730  AST 92* 86* 71* 51* 44*  ALT 93* 89* 87* 80* 67*  ALKPHOS 48 47 43 46 44  BILITOT 0.4 0.5 0.5 0.5 0.6  PROT 7.3 6.7 6.8 6.8 6.3*  ALBUMIN 3.7 3.3* 3.4* 3.4* 3.2*   No results for input(s): LIPASE, AMYLASE in the last 168 hours. No results for input(s): AMMONIA in the last 168 hours. Coagulation Profile: No results for input(s): INR, PROTIME in the last 168 hours. Cardiac Enzymes: No results for input(s): CKTOTAL, CKMB, CKMBINDEX, TROPONINI in the last 168 hours. BNP (last 3 results) No results for input(s): PROBNP in the last 8760 hours. HbA1C: No results for input(s): HGBA1C in the last 72 hours. CBG: No results for input(s): GLUCAP in the last 168 hours. Lipid  Profile: No results for input(s): CHOL, HDL, LDLCALC, TRIG, CHOLHDL, LDLDIRECT in the last 72 hours. Thyroid Function Tests: No results for input(s): TSH, T4TOTAL, FREET4, T3FREE, THYROIDAB in the last 72 hours. Anemia Panel: Recent Labs    07/02/20 0505 07/03/20 0730  FERRITIN 294 202   Sepsis Labs: No results for input(s): PROCALCITON, LATICACIDVEN in the last 168 hours.  No results found for this or any previous visit (from the past 240 hour(s)).       Radiology Studies: No results found.      Scheduled Meds: . vitamin C  500 mg Oral Daily  . baricitinib  4 mg Oral Daily  . enoxaparin (LOVENOX) injection  40 mg Subcutaneous Q24H  . Ipratropium-Albuterol  1 puff Inhalation QID  . methylPREDNISolone (SOLU-MEDROL) injection  40 mg Intravenous Q12H  . sodium chloride flush  3 mL Intravenous Q12H  . zinc sulfate  220 mg Oral Daily   Continuous Infusions: . sodium chloride Stopped (06/30/20 1118)  . sodium chloride    . remdesivir 100 mg in NS 100 mL 100 mg (07/02/20 1021)     LOS: 4 days    Time spent:40 min    Leesa Leifheit, 08/30/20, MD [facility] Pager [phone]  If 7PM-7AM, please contact night-coverage [email or web address] Password [email or web address] 07/03/2020, 9:31 AM

## 2020-07-04 DIAGNOSIS — J1282 Pneumonia due to coronavirus disease 2019: Secondary | ICD-10-CM

## 2020-07-04 DIAGNOSIS — U071 COVID-19: Principal | ICD-10-CM

## 2020-07-04 LAB — CBC WITH DIFFERENTIAL/PLATELET
Abs Immature Granulocytes: 0.96 10*3/uL — ABNORMAL HIGH (ref 0.00–0.07)
Basophils Absolute: 0.1 10*3/uL (ref 0.0–0.1)
Basophils Relative: 1 %
Eosinophils Absolute: 0.1 10*3/uL (ref 0.0–0.5)
Eosinophils Relative: 1 %
HCT: 36.6 % (ref 36.0–46.0)
Hemoglobin: 12.7 g/dL (ref 12.0–15.0)
Immature Granulocytes: 7 %
Lymphocytes Relative: 11 %
Lymphs Abs: 1.6 10*3/uL (ref 0.7–4.0)
MCH: 31 pg (ref 26.0–34.0)
MCHC: 34.7 g/dL (ref 30.0–36.0)
MCV: 89.3 fL (ref 80.0–100.0)
Monocytes Absolute: 1.2 10*3/uL — ABNORMAL HIGH (ref 0.1–1.0)
Monocytes Relative: 8 %
Neutro Abs: 10.6 10*3/uL — ABNORMAL HIGH (ref 1.7–7.7)
Neutrophils Relative %: 72 %
Platelets: 377 10*3/uL (ref 150–400)
RBC: 4.1 MIL/uL (ref 3.87–5.11)
RDW: 12.4 % (ref 11.5–15.5)
Smear Review: NORMAL
WBC: 14.6 10*3/uL — ABNORMAL HIGH (ref 4.0–10.5)
nRBC: 0 % (ref 0.0–0.2)

## 2020-07-04 LAB — COMPREHENSIVE METABOLIC PANEL
ALT: 55 U/L — ABNORMAL HIGH (ref 0–44)
AST: 30 U/L (ref 15–41)
Albumin: 3.2 g/dL — ABNORMAL LOW (ref 3.5–5.0)
Alkaline Phosphatase: 47 U/L (ref 38–126)
Anion gap: 9 (ref 5–15)
BUN: 16 mg/dL (ref 6–20)
CO2: 29 mmol/L (ref 22–32)
Calcium: 8.5 mg/dL — ABNORMAL LOW (ref 8.9–10.3)
Chloride: 98 mmol/L (ref 98–111)
Creatinine, Ser: 0.56 mg/dL (ref 0.44–1.00)
GFR calc Af Amer: >60 mL/min (ref >60)
GFR calc non Af Amer: >60 mL/min (ref >60)
Glucose, Bld: 92 mg/dL (ref 70–99)
Potassium: 4.2 mmol/L (ref 3.5–5.1)
Sodium: 136 mmol/L (ref 135–145)
Total Bilirubin: 0.6 mg/dL (ref 0.3–1.2)
Total Protein: 6.2 g/dL — ABNORMAL LOW (ref 6.5–8.1)

## 2020-07-04 LAB — MAGNESIUM: Magnesium: 2.2 mg/dL (ref 1.7–2.4)

## 2020-07-04 LAB — C-REACTIVE PROTEIN: CRP: 5.2 mg/dL — ABNORMAL HIGH (ref <1.0)

## 2020-07-04 LAB — PHOSPHORUS: Phosphorus: 3.9 mg/dL (ref 2.5–4.6)

## 2020-07-04 LAB — FIBRIN DERIVATIVES D-DIMER (ARMC ONLY): Fibrin derivatives D-dimer (ARMC): 1624.53 ng/mL (FEU) — ABNORMAL HIGH (ref 0.00–499.00)

## 2020-07-04 LAB — FERRITIN: Ferritin: 207 ng/mL (ref 11–307)

## 2020-07-04 LAB — LACTATE DEHYDROGENASE: LDH: 330 U/L — ABNORMAL HIGH (ref 98–192)

## 2020-07-04 MED ORDER — ZINC SULFATE 220 (50 ZN) MG PO CAPS: 220.0000 mg | ORAL_CAPSULE | Freq: Every day | ORAL | 0 refills | Status: AC

## 2020-07-04 MED ORDER — ZINC SULFATE 220 (50 ZN) MG PO CAPS: 220.0000 mg | ORAL_CAPSULE | Freq: Every day | ORAL | 0 refills | Status: DC

## 2020-07-04 MED ORDER — GUAIFENESIN-DM 100-10 MG/5ML PO SYRP: 10.0000 mL | ORAL_SOLUTION | ORAL | 0 refills | Status: AC | PRN

## 2020-07-04 MED ORDER — ASCORBIC ACID 500 MG PO TABS: 500.0000 mg | ORAL_TABLET | Freq: Two times a day (BID) | ORAL | 0 refills | Status: DC

## 2020-07-04 MED ORDER — GUAIFENESIN-DM 100-10 MG/5ML PO SYRP: 10.0000 mL | ORAL_SOLUTION | ORAL | 0 refills | Status: DC | PRN

## 2020-07-04 MED ORDER — IPRATROPIUM-ALBUTEROL 20-100 MCG/ACT IN AERS: 1.0000 | INHALATION_SPRAY | Freq: Four times a day (QID) | RESPIRATORY_TRACT | 0 refills | Status: DC

## 2020-07-04 MED ORDER — DEXAMETHASONE 6 MG PO TABS: 6.0000 mg | ORAL_TABLET | Freq: Every day | ORAL | 0 refills | Status: DC

## 2020-07-04 MED ORDER — ASCORBIC ACID 500 MG PO TABS: 500.0000 mg | ORAL_TABLET | Freq: Two times a day (BID) | ORAL | 0 refills | Status: AC

## 2020-07-04 MED ORDER — ALBUTEROL SULFATE HFA 108 (90 BASE) MCG/ACT IN AERS: 2.0000 | INHALATION_SPRAY | Freq: Four times a day (QID) | RESPIRATORY_TRACT | 0 refills | Status: AC | PRN

## 2020-07-04 MED ORDER — DEXAMETHASONE 6 MG PO TABS: 6.0000 mg | ORAL_TABLET | Freq: Every day | ORAL | 0 refills | Status: AC

## 2020-07-04 MED ORDER — FLUTICASONE-SALMETEROL 250-50 MCG/DOSE IN AEPB: 1.0000 | INHALATION_SPRAY | Freq: Two times a day (BID) | RESPIRATORY_TRACT | 0 refills | Status: AC

## 2020-07-04 NOTE — TOC Transition Note (Signed)
Transition of Care [facility]) - CM/SW Discharge Note   Patient Details  Name: Dawn Ware MRN: [id number] Date of Birth: 01-14-1986  [facility] CM/SW Contact:  Allayne Butcher, RN Phone Number: 07/04/2020, 2:41 PM   Clinical Narrative:     Patient is medically cleared for discharge home.  Patient will need home O2 at 2L.  Patient does not have insurance so [employer] will work patient up for charity care.  Patient is aware that she will need to put a credit card down and she is okay with that.  Oxygen will be delivered before she goes home.  Referral given to [facility] and medication management, patient given an application to fill out.  Patient will need follow up as she is going home on O2.     Final next level of care: Home/Self Care Barriers to Discharge: Barriers Resolved   Patient Goals and CMS Choice Patient states their goals for this hospitalization and ongoing recovery are:: Would like some resources for financial assistance   Choice offered to / list presented to : NA  Discharge Placement                       Discharge Plan and Services   Discharge Planning Services: [facility]igent [facility], CM Consult, Medication Assistance            DME Arranged: Oxygen DME Agency: [employer] Date DME Agency Contacted: 07/04/20 Time DME Agency Contacted: [phone] Representative spoke with at DME Agency: Oletha Cruel HH Arranged: NA          Social Determinants of Health (SDOH) Interventions     Readmission Risk Interventions No flowsheet data found.

## 2020-07-04 NOTE — TOC Progression Note (Signed)
Transition of Care [facility]) - Progression Note    Patient Details  Name: Ginette Bradway MRN: [id number] Date of Birth: [birth year]  [facility]) CM/SW Contact  Allayne Butcher, RN Phone Number: 07/04/2020, 3:30 PM  Clinical Narrative:    This RNCM will pick up patient prescriptions over at [facility] and bring them to the floor.  Patient will need to get the Vitamin C and Zinc over the counter and the decadron she can pick up at [facility] on [address]    Expected Discharge Plan: Home/Self Care Barriers to Discharge: Barriers Resolved  Expected Discharge Plan and Services Expected Discharge Plan: Home/Self Care   Discharge Planning Services: [facility]igent [facility], CM Consult, Medication Assistance   Living arrangements for the past 2 months: Single Family Home Expected Discharge Date: 07/04/20               DME Arranged: Oxygen DME Agency: [employer] Date DME Agency Contacted: 07/04/20 Time DME Agency Contacted: [phone] Representative spoke with at DME Agency: Oletha Cruel HH Arranged: NA           Social Determinants of Health (SDOH) Interventions    Readmission Risk Interventions No flowsheet data found.

## 2020-07-04 NOTE — Progress Notes (Signed)
Pt being discharged home, discharge instructions reviewed with pt, states understanding, pt with no complaints 

## 2020-07-04 NOTE — Discharge Summary (Addendum)
Physician Discharge Summary  Dawn Ware GNF:[id number] DOB: 1986/09/29 DOA: 06/29/2020  PCP: Patient, No Pcp Per  Admit date: 06/29/2020 Discharge date: 07/04/2020  Time spent: 35  minutes  Recommendations for Outpatient Follow-up:  PCP for hospital followup in one week .  Discharge Diagnoses:  Principal Problem:   Pneumonia due to COVID-19 virus Active Problems:   Acute respiratory failure due to COVID-19 [facility])   Transaminitis  Acute respiratory failure with hypoxia/Covid pneumonia COVID-19 Labs  Recent Labs    07/02/20 0505 07/03/20 0730 07/04/20 0625  FERRITIN 294 202 207  LDH 436* 392* 330*  CRP 1.0* 1.3* 5.2*   -Remdesivir per pharmacy protocol -Solu-Medrol 40 mg BID -Baricitinib 4 mg x 14 days -Combivent QID -Vitamin C and zinc per Covid protocol -Flutter valve -Incentive spirometry SATURATION QUALIFICATIONS: (Thisnote is usedto comply with regulatory documentation for home oxygen) Patient Saturations on Room Air at Rest =92% Patient Saturations on [employer] while Ambulating =86% Patient Saturations on2Liters of oxygen while Ambulating =94% Please briefly explain why patient needs home oxygen:sats drop While amb on r/a -Patient qualifies for home O2 -2 L O2; titrate to maintain SPO2 > 8% -Provide [employer] home O2 portable concentrator -pt discharged home on 2 l oxygen via [place].  Transaminitis: -continued to improve.  Discharge Condition:  Stable.  Diet recommendation:  Heart healthy diet.   Filed Weights   06/29/20 1007  Weight: 76.7 kg    History of present illness:  Dawn Ware is a 34 y.o. female with no significant past medical history who presents to the emergency room for evaluation of chest pain mostly pleuritic associated with a refractory cough.  Patient tested positive for the COVID-19 virus on 06/21/20.  She had symptoms for 6 days prior to testing positive and was seen in the emergency room on 06/25/20 with similar complaints.   Patient at that time had pulse oximetry with exertion of 93 to 94% and was discharged home. She returns to the emergency room 4 days later from the urgent care center where she had gone for evaluation of worsening chest pain and shortness of breath.  At the urgent care she was noted to be hypoxic with room air pulse oximetry in the 80s.  Patient was placed on a nonrebreather mask with improvement in her pulse oximetry to 97%.  She also received a dose of IM Solu-Medrol. She is unvaccinated Labs show VBG 7.46/46/32/17.3, sodium 137, potassium 4, chloride 100, bicarb 25, BUN 7, creatinine 0.70, magnesium 2.0, AST 140, ALT 102, BNP 30, white count 5.8, hemoglobin 13.8, hematocrit 41.2 Chest x-ray reviewed by me shows marked worsening of widespread patchy bilateral pulmonary infiltrate when compared to prior x-ray. Twelve-lead EKG reviewed by me shows sinus rhythm   ED Course: Patient is a 34 year old female who presents to the ER for evaluation of worsening pleuritic chest pain and shortness of breath.  Patient tested positive for the COVID-19 virus on 06/21/20 but had symptoms 6 days prior to testing positive.  She was seen at urgent care and referred to the ER for hypoxia.  Room air pulse oximetry was in the 80s and improved following oxygen supplementation.  She received a dose of IM Solu-Medrol at the urgent care center.  She received remdesivir in the ER and will be admitted to the hospital for further evaluation.  Hospital Course:  Pt admitted on 9/3 for a/c hypoxic respiratory failure attributed to covid-19 pneumonia. She was diagnosed with covid--19 on 06/21/20.She was started on Covid-19 pna protocol with  remdesivir and barcitinab and solumedrol since the 3rd and has been stable initially on HFNC 6L and inflammatory markers have trended down .Since yesterday the 7th pt has been oxygenating better on 3L  with o2 sats of 97%. Labs have improved and pt remains stable. Today pulse oximetry upon  ambulation has gone down intermittently to 888% and resumed to 93 and above.  Asked nurse Verl Bangsamanda perez  to d/w pt if ok with d/c as I just did chart review after rounds and pt is agreeing to be d/c and feels comfortable with home oxygen upon d/c and is to f/uw with pcp to further titrate and manage home oxygen. While in house pt has received remdesivir and barcitinab and no longer needs to cont barcitiniab.  Procedures: N/A Consultations: N/A  Discharge Exam: Vitals:   07/04/20 0858 07/04/20 1211  BP:  112/69  Pulse:  89  Resp: 17 18  Temp:  97.9 F (36.6 C)  SpO2:  97%   Physical Exam Vitals and nursing note reviewed.  Constitutional:      General: She is not in acute distress.    Appearance: Normal appearance. She is normal weight. She is not ill-appearing.  HENT:     Head: Normocephalic and atraumatic.     Right Ear: External ear normal.     Left Ear: External ear normal.     Nose: Nose normal.     Mouth/Throat:     Pharynx: Oropharynx is clear.  Eyes:     Extraocular Movements: Extraocular movements intact.  Cardiovascular:     Rate and Rhythm: Normal rate.     Pulses: Normal pulses.     Heart sounds: Normal heart sounds. No murmur heard.   Pulmonary:     Effort: Pulmonary effort is normal.     Breath sounds: Rhonchi present.  Abdominal:     Palpations: Abdomen is soft.  Musculoskeletal:     Cervical back: Normal range of motion.  Skin:    General: Skin is warm.  Neurological:     General: No focal deficit present.     Mental Status: She is alert and oriented to person, place, and time. Mental status is at baseline.  Psychiatric:        Mood and Affect: Mood normal.    Discharge Instructions Discharge Instructions    Call MD for:  difficulty breathing, headache or visual disturbances   Complete by: As directed    Call MD for:  persistant nausea and vomiting   Complete by: As directed    Call MD for:  severe uncontrolled pain   Complete by: As directed     Call MD for:  temperature >100.4   Complete by: As directed    Diet - low sodium heart healthy   Complete by: As directed    Discharge instructions   Complete by: As directed    Please followup with primary care to manage home oxygen and check pulse oximetry at home on daily basis for next 2-3 weeks and create a diary and document daily oxygen levels.   Increase activity slowly   Complete by: As directed    Increase activity slowly   Complete by: As directed      Allergies as of 07/04/2020   No Known Allergies     Medication List    TAKE these medications   albuterol 108 (90 Base) MCG/ACT inhaler Commonly known as: VENTOLIN HFA Inhale 2 puffs into the lungs every 6 (six) hours as needed for  wheezing or shortness of breath.   ascorbic acid 500 MG tablet Commonly known as: VITAMIN C Take 1 tablet (500 mg total) by mouth 2 (two) times daily for 15 days.   dexamethasone 6 MG tablet Commonly known as: Decadron Take 1 tablet (6 mg total) by mouth daily for 5 days.   Fluticasone-Salmeterol 250-50 MCG/DOSE Aepb Commonly known as: Advair Diskus Inhale 1 puff into the lungs in the morning and at bedtime.   guaiFENesin-dextromethorphan 100-10 MG/5ML syrup Commonly known as: ROBITUSSIN DM Take 10 mLs by mouth every 4 (four) hours as needed for cough.   zinc sulfate 220 (50 Zn) MG capsule Take 1 capsule (220 mg total) by mouth daily. Start taking on: July 05, 2020            Durable Medical Equipment  (From admission, onward)         Start     Ordered   07/04/20 1458  DME Oxygen  Once       Comments: Primary care to manage and monitor oxygen therapy and length of need.  Question Answer Comment  Length of Need 6 Months   Mode or (Route) Nasal cannula   Liters per Minute 2   Frequency Continuous (stationary and portable oxygen unit needed)   Oxygen conserving device No   Oxygen delivery system Gas      07/04/20 1458   07/03/20 1234  For home use only DME oxygen   Once       Comments: SATURATION QUALIFICATIONS: (This note is used to comply with regulatory documentation for home oxygen) Patient Saturations on Room Air at Rest = 92% Patient Saturations on Room Air while Ambulating =86% Patient Saturations on2 Liters of oxygen while Ambulating =94% Please briefly explain why patient needs home oxygen:sats drop  While amb on r/a -Patient qualifies for home O2 -2 L O2; titrate to maintain SPO2 > 8% -Provide [employer] home O2 portable concentrator  Question Answer Comment  Length of Need Lifetime   Mode or (Route) Nasal cannula   Liters per Minute 2   Frequency Continuous (stationary and portable oxygen unit needed)   Oxygen conserving device Yes   Oxygen delivery system Gas      07/03/20 1233         No Known Allergies    The results of significant diagnostics from this hospitalization (including imaging, microbiology, ancillary and laboratory) are listed below for reference.    Significant Diagnostic Studies: DG Chest 1 View  Result Date: 06/29/2020 CLINICAL DATA:  Hypoxia.  Coronavirus infection. EXAM: CHEST  1 VIEW COMPARISON:  06/25/2020 FINDINGS: Marked worsening of widespread patchy bilateral pulmonary infiltrates. No lobar consolidation or collapse. No visible effusion. IMPRESSION: Marked worsening of widespread patchy bilateral pulmonary infiltrates. Electronically Signed   By: Paulina Fusi M.D.   On: 06/29/2020 10:39   DG Chest 2 View  Result Date: 06/25/2020 CLINICAL DATA:  COVID positive EXAM: CHEST - 2 VIEW COMPARISON:  None. FINDINGS: Patchy bilateral airspace disease, mild to moderate in extent. No edema, effusion, or pneumothorax. Normal heart size and mediastinal contours. IMPRESSION: Bilateral atypical pneumonia. Electronically Signed   By: Marnee Spring M.D.   On: 06/25/2020 05:16   CT Angio Chest PE W and/or Wo Contrast  Result Date: 06/29/2020 CLINICAL DATA:  Chest pain, COVID positive EXAM: CT ANGIOGRAPHY CHEST WITH  CONTRAST TECHNIQUE: Multidetector CT imaging of the chest was performed using the standard protocol during bolus administration of intravenous contrast. Multiplanar CT image reconstructions and MIPs  were obtained to evaluate the vascular anatomy. CONTRAST:  OMNIPAQUE IOHEXOL 350 MG/ML SOLN COMPARISON:  None. FINDINGS: Cardiovascular: Satisfactory opacification of the pulmonary arteries to the segmental level. No evidence of pulmonary embolism. Normal heart size. No pericardial effusion. Thoracic aorta is normal in caliber. 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 Mediastinum/Nodes: Small, likely reactive lymph nodes. Visualized thyroid is unremarkable. Lungs/Pleura: Diffuse bilateral consolidative and ground-glass opacities with relative apical sparing. No pleural effusion. No pneumothorax. Upper Abdomen: No acute abnormality. Musculoskeletal: No acute osseous abnormality. Review of the MIP images confirms the above findings. IMPRESSION: No evidence of acute pulmonary embolism. Diffuse bilateral consolidative and ground-glass opacities likely reflecting COVID-19 pneumonia. Electronically Signed   By: Guadlupe Spanish M.D.   On: 06/29/2020 14:32    Microbiology: No results found for this or any previous visit (from the past 240 hour(s)).   Labs: Basic Metabolic Panel: Recent Labs  Lab 06/30/20 1058 07/01/20 0638 07/02/20 0505 07/03/20 0730 07/04/20 0625  NA 136 139 138 136 136  K 3.9 4.3 4.7 3.7 4.2  CL 105 103 102 99 98  CO2 25 27 27 29 29   GLUCOSE 128* 122* 128* 96 92  BUN 9 14 18 19 16   CREATININE 0.51 0.56 0.65 0.67 0.56  CALCIUM 9.0 8.7* 8.7* 8.5* 8.5*  MG 1.9 2.2 2.1 2.0 2.2  PHOS 2.9 3.4 3.5 3.0 3.9   Liver Function Tests: Recent Labs  Lab 06/30/20 1058 07/01/20 0638 07/02/20 0505 07/03/20 0730 07/04/20 0625  AST 86* 71* 51* 44* 30  ALT 89* 87* 80* 67* 55*  ALKPHOS 47 43 46 44 47  BILITOT 0.5 0.5 0.5 0.6 0.6  PROT 6.7 6.8 6.8 6.3* 6.2*  ALBUMIN  3.3* 3.4* 3.4* 3.2* 3.2*   No results for input(s): LIPASE, AMYLASE in the last 168 hours. No results for input(s): AMMONIA in the last 168 hours. CBC: Recent Labs  Lab 06/30/20 1058 07/01/20 0638 07/02/20 0505 07/03/20 0730 07/04/20 0625  WBC 8.4 9.1 11.9* 15.4* 14.6*  NEUTROABS 6.2 7.3 9.2* 11.7* 10.6*  HGB 11.7* 12.3 12.7 12.2 12.7  HCT 35.0* 36.9 37.8 36.3 36.6  MCV 91.1 92.0 90.4 91.7 89.3  PLT 233 297 341 335 377   Cardiac Enzymes: No results for input(s): CKTOTAL, CKMB, CKMBINDEX, TROPONINI in the last 168 hours. BNP: BNP (last 3 results) Recent Labs    06/29/20 1006  BNP 30.7    ProBNP (last 3 results) No results for input(s): PROBNP in the last 8760 hours.  CBG: No results for input(s): GLUCAP in the last 168 hours.  Signed:  09/03/20 MD.  [facility] 07/04/2020, 3:03 PM

## 2020-07-04 NOTE — Progress Notes (Signed)
SATURATION QUALIFICATIONS: (This note is used to comply with regulatory documentation for home oxygen)  Patient Saturations on Room Air at Rest = 90%  Patient Saturations on Room Air while Ambulating = 88%  Patient Saturations on 2 Liters of oxygen while Ambulating = 93%  

## 2020-07-05 ENCOUNTER — Telehealth: Payer: Self-pay | Admitting: Pharmacy Technician

## 2020-07-05 NOTE — Telephone Encounter (Signed)
Patient received a 30 day supply of medication.  Provided patient with new patient packet to obtain ongoing [facility] services.  [facility] must receive requested financial documentation within 30 days in order to determine eligibility and provide additional medication assistance.  [place] Mattila J. Machelle Raybon Care Manager [facility] 

## 2020-07-24 ENCOUNTER — Ambulatory Visit: Payer: Medicaid Other | Admitting: Gerontology

## 2020-08-06 ENCOUNTER — Other Ambulatory Visit: Payer: Self-pay

## 2020-09-19 ENCOUNTER — Telehealth: Payer: Self-pay | Admitting: Pharmacist

## 2020-09-19 NOTE — Telephone Encounter (Signed)
Patient failed to provide requested 2021 financial documentation. No additional medication assistance will be provided by [facility] without the required proof of income documentation. Patient notified by letter Debra Cheek Administrative Assistant [facility] 

## 2024-01-07 ENCOUNTER — Ambulatory Visit
Admission: EM | Admit: 2024-01-07 | Discharge: 2024-01-07 | Disposition: A | Discharge status: Home / Self Care | Payer: MEDICAID | Attending: Emergency Medicine | Admitting: Emergency Medicine

## 2024-01-07 DIAGNOSIS — M545 Low back pain, unspecified: Secondary | ICD-10-CM

## 2024-01-07 LAB — POCT URINALYSIS DIP (MANUAL ENTRY)
Bilirubin, UA: NEGATIVE
Glucose, UA: NEGATIVE mg/dL
Ketones, POC UA: NEGATIVE mg/dL
Leukocytes, UA: NEGATIVE
Nitrite, UA: NEGATIVE
Protein Ur, POC: NEGATIVE mg/dL
Spec Grav, UA: 1.01 (ref 1.010–1.025)
Urobilinogen, UA: 0.2 U/dL
pH, UA: 5.5 (ref 5.0–8.0)

## 2024-01-07 LAB — POCT URINE PREGNANCY: Preg Test, Ur: NEGATIVE

## 2024-01-07 MED ORDER — METHOCARBAMOL 500 MG PO TABS: 500.0000 mg | ORAL_TABLET | Freq: Two times a day (BID) | ORAL | 0 refills | Status: AC | PRN

## 2024-01-07 NOTE — Discharge Instructions (Addendum)
 Take ibuprofen as needed for discomfort.  Take the muscle relaxer as needed for muscle spasm; Do not drive, operate machinery, or drink alcohol with this medication as it can cause drowsiness.   Your urine does not show signs of infection but does have trace blood.  Follow up with your primary care provider for recheck of your urine.    Go to the emergency department if you have worsening symptoms.

## 2024-01-07 NOTE — ED Provider Notes (Signed)
 Dawn Ware    CSN: [id number] Arrival date & time: 01/07/24  1221      History   Chief Complaint Chief Complaint  Patient presents with   Back Pain    HPI Dawn Ware is a 38 y.o. female.  Patient presents with right lower back pain x 1 day.  No falls or injury.  The pain is worse with movement and standing; improves with rest.  She took ibuprofen and Tylenol this morning which also improved her pain.  She denies numbness, weakness, paresthesias, saddle anesthesia, loss of bowel/bladder control, fever, chills, cough, shortness of breath, chest pain, abdominal pain, dysuria, hematuria, flank pain.    The history is provided by the patient and medical records.    History reviewed. No pertinent past medical history.  Patient Active Problem List   Diagnosis Date Noted   Pneumonia due to COVID-19 virus 06/29/2020   Acute respiratory failure due to COVID-19 [facility]) 06/29/2020   Transaminitis 06/29/2020    History reviewed. No pertinent surgical history.  OB History   No obstetric history on file.      Home Medications    Prior to Admission medications   Medication Sig Start Date End Date Taking? Authorizing Provider  methocarbamol (ROBAXIN) 500 MG tablet Take 1 tablet (500 mg total) by mouth 2 (two) times daily as needed for muscle spasms. 01/07/24  Yes Mickie Bail, NP  albuterol (VENTOLIN HFA) 108 (90 Base) MCG/ACT inhaler Inhale 2 puffs into the lungs every 6 (six) hours as needed for wheezing or shortness of breath. 07/04/20 08/03/20  Gertha Calkin, MD  Fluticasone-Salmeterol (ADVAIR DISKUS) 250-50 MCG/DOSE AEPB Inhale 1 puff into the lungs in the morning and at bedtime. 07/04/20 08/03/20  Gertha Calkin, MD  guaiFENesin-dextromethorphan (ROBITUSSIN DM) 100-10 MG/5ML syrup Take 10 mLs by mouth every 4 (four) hours as needed for cough. Patient not taking: Reported on 01/07/2024 07/04/20   Gertha Calkin, MD    Family History Family History  Problem Relation Age of  Onset   Hypertension Mother    Hypertension Father     Social History Social History   Tobacco Use   Smoking status: Every Day    Types: Cigarettes, E-cigarettes   Smokeless tobacco: Never  Vaping Use   Vaping status: Every Day  Substance Use Topics   Alcohol use: Not Currently   Drug use: Not Currently     Allergies   Patient has no known allergies.   Review of Systems Review of Systems  Constitutional:  Negative for chills and fever.  Gastrointestinal:  Negative for abdominal pain, constipation, diarrhea, nausea and vomiting.  Genitourinary:  Negative for dysuria, flank pain and hematuria.  Musculoskeletal:  Positive for back pain. Negative for gait problem and joint swelling.  Neurological:  Negative for weakness and numbness.     Physical Exam Triage Vital Signs ED Triage Vitals  Encounter Vitals Group     BP 01/07/24 1300 131/83     Systolic BP Percentile --      Diastolic BP Percentile --      Pulse Rate 01/07/24 1300 73     Resp 01/07/24 1300 18     Temp 01/07/24 1300 97.8 F (36.6 C)     Temp src --      SpO2 01/07/24 1300 96 %     Weight --      Height --      Head Circumference --      Peak Flow --  Pain Score 01/07/24 1301 7     Pain Loc --      Pain Education --      Exclude from Growth Chart --    No data found.  Updated Vital Signs BP 131/83   Pulse 73   Temp 97.8 F (36.6 C)   Resp 18   LMP 12/24/2023   SpO2 96%   Visual Acuity Right Eye Distance:   Left Eye Distance:   Bilateral Distance:    Right Eye Near:   Left Eye Near:    Bilateral Near:     Physical Exam Constitutional:      General: She is not in acute distress. HENT:     Mouth/Throat:     Mouth: Mucous membranes are moist.  Cardiovascular:     Rate and Rhythm: Normal rate and regular rhythm.     Heart sounds: Normal heart sounds.  Pulmonary:     Effort: Pulmonary effort is normal. No respiratory distress.     Breath sounds: Normal breath sounds.   Abdominal:     General: Bowel sounds are normal.     Palpations: Abdomen is soft.     Tenderness: There is no abdominal tenderness. There is no right CVA tenderness, left CVA tenderness, guarding or rebound.  Musculoskeletal:        General: No swelling, tenderness or deformity. Normal range of motion.  Skin:    Capillary Refill: Capillary refill takes less than 2 seconds.     Findings: No bruising, erythema, lesion or rash.  Neurological:     General: No focal deficit present.     Mental Status: She is alert and oriented to person, place, and time.     Sensory: No sensory deficit.     Motor: No weakness.     Gait: Gait normal.      UC Treatments / Results  Labs (all labs ordered are listed, but only abnormal results are displayed) Labs Reviewed  POCT URINALYSIS DIP (MANUAL ENTRY) - Abnormal; Notable for the following components:      Result Value   Blood, UA trace-intact (*)    All other components within normal limits  POCT URINE PREGNANCY    EKG   Radiology No results found.  Procedures Procedures (including critical care time)  Medications Ordered in UC Medications - No data to display  Initial Impression / Assessment and Plan / UC Course  I have reviewed the triage vital signs and the nursing notes.  Pertinent labs & imaging results that were available during my care of the patient were reviewed by me and considered in my medical decision making (see chart for details).    Right low back pain without sciatica.  Urine has trace blood but no indication of infection.  Urine pregnancy negative.  Instructed patient to continue ibuprofen as needed.  Treating with methocarbamol; precautions for drowsiness with this medication discussed.  Discussed finding of trace blood in urine and instructed patient to follow-up with her PCP for recheck.  ED precautions given.  Education provided on back pain.  Patient agrees to plan of care.  Final Clinical Impressions(s) / UC  Diagnoses   Final diagnoses:  Acute right-sided low back pain without sciatica     Discharge Instructions      Take ibuprofen as needed for discomfort.  Take the muscle relaxer as needed for muscle spasm; Do not drive, operate machinery, or drink alcohol with this medication as it can cause drowsiness.   Your urine does  not show signs of infection but does have trace blood.  Follow up with your primary care provider for recheck of your urine.    Go to the emergency department if you have worsening symptoms.       ED Prescriptions     Medication Sig Dispense Auth. Provider   methocarbamol (ROBAXIN) 500 MG tablet Take 1 tablet (500 mg total) by mouth 2 (two) times daily as needed for muscle spasms. 10 tablet Mickie Bail, NP      I have reviewed the PDMP during this encounter.   Mickie Bail, NP 01/07/24 [phone]

## 2024-01-07 NOTE — ED Triage Notes (Addendum)
 Patient to Urgent Care with complaints of lower back pain. Denies any known injury. No radiation. Relieved with rest. Worse when changing position/ standing.  Symptoms started yesterday. Reports she was woken up repeatedly in the night due to pain.

## 2024-01-08 ENCOUNTER — Emergency Department (HOSPITAL_COMMUNITY)
Admission: EM | Admit: 2024-01-08 | Discharge: 2024-01-08 | Disposition: A | Discharge status: Home / Self Care | Payer: MEDICAID | Attending: Emergency Medicine | Admitting: Emergency Medicine

## 2024-01-08 ENCOUNTER — Emergency Department (HOSPITAL_COMMUNITY): Payer: MEDICAID

## 2024-01-08 ENCOUNTER — Other Ambulatory Visit: Payer: Self-pay

## 2024-01-08 ENCOUNTER — Encounter (HOSPITAL_COMMUNITY): Payer: Self-pay

## 2024-01-08 DIAGNOSIS — M545 Low back pain, unspecified: Secondary | ICD-10-CM | POA: Insufficient documentation

## 2024-01-08 LAB — BASIC METABOLIC PANEL
Anion gap: 12 (ref 5–15)
BUN: 14 mg/dL (ref 6–20)
CO2: 23 mmol/L (ref 22–32)
Calcium: 9.4 mg/dL (ref 8.9–10.3)
Chloride: 106 mmol/L (ref 98–111)
Creatinine, Ser: 1.18 mg/dL — ABNORMAL HIGH (ref 0.44–1.00)
GFR, Estimated: >60 mL/min (ref >60)
Glucose, Bld: 99 mg/dL (ref 70–99)
Potassium: 4.8 mmol/L (ref 3.5–5.1)
Sodium: 141 mmol/L (ref 135–145)

## 2024-01-08 LAB — CBC
HCT: 38.7 % (ref 36.0–46.0)
Hemoglobin: 12.8 g/dL (ref 12.0–15.0)
MCH: 30.8 pg (ref 26.0–34.0)
MCHC: 33.1 g/dL (ref 30.0–36.0)
MCV: 93.3 fL (ref 80.0–100.0)
Platelets: 240 10*3/uL (ref 150–400)
RBC: 4.15 MIL/uL (ref 3.87–5.11)
RDW: 12.6 % (ref 11.5–15.5)
WBC: 8.3 10*3/uL (ref 4.0–10.5)
nRBC: 0 % (ref 0.0–0.2)

## 2024-01-08 LAB — URINALYSIS, ROUTINE W REFLEX MICROSCOPIC
Bilirubin Urine: NEGATIVE
Glucose, UA: NEGATIVE mg/dL
Hgb urine dipstick: NEGATIVE
Ketones, ur: NEGATIVE mg/dL
Leukocytes,Ua: NEGATIVE
Nitrite: NEGATIVE
Protein, ur: NEGATIVE mg/dL
Specific Gravity, Urine: 1.006 (ref 1.005–1.030)
pH: 6 (ref 5.0–8.0)

## 2024-01-08 LAB — HCG, SERUM, QUALITATIVE: Preg, Serum: NEGATIVE

## 2024-01-08 MED ORDER — ONDANSETRON HCL 4 MG/2ML IJ SOLN
4.0000 mg | Freq: Four times a day (QID) | INTRAMUSCULAR | Status: DC | PRN
  Administered 2024-01-08: 4 mg via INTRAVENOUS
  Filled 2024-01-08: qty 2

## 2024-01-08 MED ORDER — LIDOCAINE 5 % EX PTCH: 1.0000 | MEDICATED_PATCH | CUTANEOUS | 0 refills | Status: AC

## 2024-01-08 MED ORDER — SODIUM CHLORIDE 0.9 % IV BOLUS
500.0000 mL | Freq: Once | INTRAVENOUS | Status: AC
  Administered 2024-01-08: 500 mL via INTRAVENOUS

## 2024-01-08 MED ORDER — MORPHINE SULFATE (PF) 4 MG/ML IV SOLN
4.0000 mg | Freq: Once | INTRAVENOUS | Status: AC
  Administered 2024-01-08: 4 mg via INTRAVENOUS
  Filled 2024-01-08: qty 1

## 2024-01-08 NOTE — ED Provider Notes (Signed)
 [facility] Provider Note   CSN: [id number] Arrival date & time: 01/08/24  1105     History  No chief complaint on file.   Dawn Ware is a 38 y.o. female who presents to ED concerned for low right-sided back pain x2 days. Denies trauma or falls. Patient went to UC yesterday and states that there was blood in urine so she possibly has a kidney stone. Patient was prescribed muscle relaxer which did provide some relief - but patient still stating that she is in a lot of pain and wants further imaging for her possible kidney stone. Last BM yesterday.  Denies fever, chest pain, dyspnea, cough, nausea, vomiting, diarrhea, dysuria, hematuria, hematochezia, vaginal discharge.   HPI     Home Medications Prior to Admission medications   Medication Sig Start Date End Date Taking? Authorizing Provider  albuterol (VENTOLIN HFA) 108 (90 Base) MCG/ACT inhaler Inhale 2 puffs into the lungs every 6 (six) hours as needed for wheezing or shortness of breath. 07/04/20 08/03/20  Gertha Calkin, MD  Fluticasone-Salmeterol (ADVAIR DISKUS) 250-50 MCG/DOSE AEPB Inhale 1 puff into the lungs in the morning and at bedtime. 07/04/20 08/03/20  Gertha Calkin, MD  guaiFENesin-dextromethorphan (ROBITUSSIN DM) 100-10 MG/5ML syrup Take 10 mLs by mouth every 4 (four) hours as needed for cough. Patient not taking: Reported on 01/07/2024 07/04/20   Gertha Calkin, MD  methocarbamol (ROBAXIN) 500 MG tablet Take 1 tablet (500 mg total) by mouth 2 (two) times daily as needed for muscle spasms. 01/07/24   Mickie Bail, NP      Allergies    Patient has no known allergies.    Review of Systems   Review of Systems  Musculoskeletal:        Back pain    Physical Exam Updated Vital Signs BP 119/71 (BP Location: Right Arm)   Pulse 67   Temp 98.3 F (36.8 C)   Resp 18   LMP 12/24/2023   SpO2 100%  Physical Exam Vitals and nursing note reviewed.  Constitutional:      General:  She is not in acute distress.    Appearance: She is not ill-appearing or toxic-appearing.  HENT:     Head: Normocephalic and atraumatic.     Mouth/Throat:     Mouth: Mucous membranes are moist.  Eyes:     General: No scleral icterus.       Right eye: No discharge.        Left eye: No discharge.     Conjunctiva/sclera: Conjunctivae normal.  Cardiovascular:     Rate and Rhythm: Normal rate and regular rhythm.     Pulses: Normal pulses.     Heart sounds: Normal heart sounds. No murmur heard. Pulmonary:     Effort: Pulmonary effort is normal. No respiratory distress.     Breath sounds: Normal breath sounds. No wheezing, rhonchi or rales.  Abdominal:     General: Abdomen is flat. Bowel sounds are normal. There is no distension.     Palpations: Abdomen is soft. There is no mass.     Tenderness: There is no abdominal tenderness.  Musculoskeletal:     Right lower leg: No edema.     Left lower leg: No edema.  Skin:    General: Skin is warm and dry.     Findings: No rash.  Neurological:     General: No focal deficit present.     Mental Status: She is alert  and oriented to person, place, and time. Mental status is at baseline.  Psychiatric:        Mood and Affect: Mood normal.     ED Results / Procedures / Treatments   Labs (all labs ordered are listed, but only abnormal results are displayed) Labs Reviewed  URINALYSIS, ROUTINE W REFLEX MICROSCOPIC  HCG, SERUM, QUALITATIVE  BASIC METABOLIC PANEL  CBC    EKG None  Radiology No results found.  Procedures Procedures    Medications Ordered in ED Medications - No data to display  ED Course/ Medical Decision Making/ A&P                                 Medical Decision Making Amount and/or Complexity of Data Reviewed Labs: ordered. Radiology: ordered.  Risk Prescription drug management.    This patient presents to the ED for concern of back pain, this involves an extensive number of treatment options, and is a  complaint that carries with it a high risk of complications and morbidity.  The differential diagnosis includes gastroenteritis, colitis, small bowel obstruction, appendicitis, cholecystitis, pancreatitis, nephrolithiasis, UTI, pyleonephritis  Co morbidities that complicate the patient evaluation  none   Additional history obtained:  No PCP listed in chart. Will refer to [facility] yesterday with hbg but otherwise reassuring UC note from yesterday: "Patient presents with right lower back pain x 1 day. No falls or injury. The pain is worse with movement and standing; improves with rest. She took ibuprofen and Tylenol this morning which also improved her pain. She denies numbness, weakness, paresthesias, saddle anesthesia, loss of bowel/bladder control, fever, chills, cough, shortness of breath, chest pain, abdominal pain, dysuria, hematuria, flank pain."   Problem List / ED Course / Critical interventions / Medication management  Patient presented for low right sided back pain.  Patient concern for kidney stone since her muscle relaxer did not fully resolve her pain.  Patient denies any other infectious symptoms today.  No dysuria or hematuria.  UA from UC yesterday showing hemoglobin but is otherwise reassuring.  Physical exam unremarkable.  Patient afebrile with stable vitals. I Ordered, and personally interpreted labs.  CBC without leukocytosis or anemia.  hCG negative.  BMP with mild elevation in creatinine at 1.18.  UA reassuring. I ordered imaging studies including CT Renal Study: due to favored nephrolithiasis over GI etiology for patient's abdominal pain. Imaging is pending. I have reviewed the patients home medicines and have made adjustments as needed   Social Determinants of Health:  None  3PM Care of Zanyah Folmer transferred to [facility] [place] at the end of my shift as the patient will require reassessment once labs/imaging have resulted. Patient presentation, ED course,  and plan of care discussed with review of all pertinent labs and imaging. Please see his/her note for further details regarding further ED course and disposition. Plan at time of handoff is reassess patient after imaging results. This may be altered or completely changed at the discretion of the oncoming team pending results of further workup.           Final Clinical Impression(s) / ED Diagnoses Final diagnoses:  None    Rx / DC Orders ED Discharge Orders     None         Dorthy Cooler, [place] 01/08/24 1509    Terald Sleeper, MD 01/08/24 1752

## 2024-01-08 NOTE — ED Provider Notes (Signed)
 Right lower back pain. Patient had concern for kidney stones. Follow up on CT renal Physical Exam  BP 112/74   Pulse 66   Temp 97.7 F (36.5 C) (Oral)   Resp 18   LMP 12/24/2023   SpO2 100%   Physical Exam Vitals and nursing note reviewed.  Constitutional:      Appearance: Normal appearance.     Comments: Well-appearing, sitting up in the stretcher and moves around comfortably  HENT:     Head: Atraumatic.  Cardiovascular:     Rate and Rhythm: Normal rate and regular rhythm.  Pulmonary:     Effort: Pulmonary effort is normal.  Abdominal:     Comments: Abdomen soft nontender.  No CVA tenderness bilaterally  Musculoskeletal:     Comments: No tenderness to palpation of the spinous processes of the cervical, thoracic, and lumbar spine  No tenderness palpation of the paraspinal muscles  Neurological:     General: No focal deficit present.     Mental Status: She is alert.     Comments: Walks without difficulty  5/5 strength of the bilateral upper and lower extremities  Intact sensation of the bilateral upper and lower extremities  Psychiatric:        Mood and Affect: Mood normal.        Behavior: Behavior normal.     Procedures  Procedures  ED Course / MDM    Medical Decision Making Amount and/or Complexity of Data Reviewed Labs: ordered. Radiology: ordered.  Risk Prescription drug management.   Patient received at signout.  Please see the prior providers note for full details.  In short, patient presents with right lower back pain ongoing for past 2 days.  She denies any trauma or falls.  She was seen by urgent care and prescribed a muscle relaxer which she did not feel like improved her symptoms significantly.   Normal vital signs.  She does not have any spinal tenderness palpation on exam.  Moves around comfortably.  Neurologically intact in the bilateral upper and lower extremities.  She does not have any signs of UTI.  Mild increase in creatinine on BMP.   Normal CBC.  Pregnancy negative.  CT renal stone study does not show any obstructing stones that could be causing her pain.  Suspect this is musculoskeletal pain.  No red flag signs such as saddle anesthesia, IV drug use, malignancy, fevers, weakness. Discussed use of Tylenol and ibuprofen with patient.  Heating packs on the lower back to help with pain.  Prescribed her lidocaine patches to help with the pain.  Discussed follow-up with PCP if symptoms not improving within the next week.  Return precautions given.       Arabella Merles, PA-C 01/08/24 1650    Tegeler, Canary Brim, MD 01/08/24 [phone]

## 2024-01-08 NOTE — Discharge Instructions (Addendum)
 You had a slight increase in your creatinine which is a measure of your kidney function.  This is likely secondary to dehydration.  Please increase your intake of water at home.  Your blood counts and electrolytes were normal today.  Your pregnancy test is negative.  Your urine did not show any signs of infection.  Your CT scan did not show any signs of a kidney stone that would be causing your pain.  I have included the CT scan results below.  Your lower back pain is likely due to a muscle strain.  As discussed you may use the lidocaine patches as prescribed.  You may apply 1 patch to your lower back for up to 12 hours.  You must remove the patch for full 12 hours before applying another path.  You may take up to 1000mg  of tylenol every 6 hours as needed for pain. Do not take more then 4g per day.   You may use up to 600mg  ibuprofen every 6 hours as needed for pain.  Do not exceed 2.4g of ibuprofen per day.  You may also use heating packs on your back to help with pain.  Please follow-up with your PCP within the next week if your symptoms not improving.  You may benefit from a physical therapy referral.  Return to the ER if you have any unexplained fevers, severe worsening of your pain, any other new or concerning symptoms.   "EXAM: CT ABDOMEN AND PELVIS WITHOUT CONTRAST   TECHNIQUE: Multidetector CT imaging of the abdomen and pelvis was performed following the standard protocol without IV contrast.   RADIATION DOSE REDUCTION: This exam was performed according to the departmental dose-optimization program which includes automated exposure control, adjustment of the mA and/or kV according to patient size and/or use of iterative reconstruction technique.   COMPARISON:  None recent   FINDINGS: Lower chest: Lung bases are clear   Hepatobiliary: No focal liver abnormality is seen. No gallstones, gallbladder wall thickening, or biliary dilatation.   Pancreas: Unremarkable. No  pancreatic ductal dilatation or surrounding inflammatory changes.   Spleen: Normal in size without focal abnormality.   Adrenals/Urinary Tract: Adrenal glands are normal. Kidneys show multiple punctate right-sided kidney stones. 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 No definite ureteral stone.   Stomach/Bowel: Stomach is within normal limits. Appendix appears normal. No evidence of bowel wall thickening, distention, or inflammatory changes.   Vascular/Lymphatic: No significant vascular findings are present. No enlarged abdominal or pelvic lymph nodes.   Reproductive: Uterus and bilateral adnexa are unremarkable.   Other: Negative for pelvic effusion or free air   Musculoskeletal: No acute or suspicious osseous abnormality   IMPRESSION: Negative for hydronephrosis or hydroureter. Multiple punctate right-sided kidney stones.   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 No ureteral stone     Electronically Signed   By: Jasmine Pang M.D.   On: 01/08/2024 15:50"

## 2024-01-08 NOTE — ED Triage Notes (Addendum)
 Pt c/o back pain 2 days ago; worse yesterday, seen at urgent care, was told she had blood in her urine and possibly had kidney stones; directed to go to hospital if worse; pt given muscle relaxers, taking without relief; states pain has moved to r flank from medial lower back; today pain is constant; denies fevers, denies n/v, deneis dysuria

## 2024-05-20 ENCOUNTER — Ambulatory Visit: Admission: EM | Admit: 2024-05-20 | Discharge: 2024-05-20 | Discharge status: Against Medical Advice | Payer: MEDICAID

## 2024-06-17 ENCOUNTER — Ambulatory Visit (INDEPENDENT_AMBULATORY_CARE_PROVIDER_SITE_OTHER): Payer: MEDICAID | Admitting: Radiology

## 2024-06-17 ENCOUNTER — Encounter: Payer: Self-pay | Admitting: Radiology

## 2024-06-17 VITALS — BP 132/86 | HR 97 | Ht 64.75 in | Wt 150.0 lb

## 2024-06-17 DIAGNOSIS — Z1331 Encounter for screening for depression: Secondary | ICD-10-CM

## 2024-06-17 DIAGNOSIS — R4586 Emotional lability: Secondary | ICD-10-CM

## 2024-06-17 NOTE — Progress Notes (Signed)
   Dawn Ware 06-08-1986 [id number]   History:  38 y.o. G2P2 presents as a new patient. Complains of mood swings (starts 3 weeks prior to periods x's years---lost 2 jobs due to mood swings). Over the counter mood stabilizers, no help.  Gynecologic History Patient's last menstrual period was 06/03/2024 (approximate).   Contraception/Family planning: tubal ligation Sexually active: yes Last Pap: 2024. Results were: normal per pt   Obstetric History OB History  Gravida Para Term Preterm AB Living  2 2    3   SAB IAB Ectopic Multiple Live Births     1 3    # Outcome Date GA Lbr Len/2nd Weight Sex Type Anes PTL Lv  2A Para           2B Para           1 Para                06/17/2024   10:50 AM  Depression screen PHQ 2/9  Decreased Interest 2  Down, Depressed, Hopeless 1  PHQ - 2 Score 3  Altered sleeping 1  Tired, decreased energy 3  Change in appetite 0  Feeling bad or failure about yourself  1  Trouble concentrating 1  Moving slowly or fidgety/restless 2  Suicidal thoughts 1  PHQ-9 Score 12  Difficult doing work/chores Somewhat difficult     The following portions of the patient's history were reviewed and updated as appropriate: allergies, current medications, past family history, past medical history, past social history, past surgical history, and problem list.  ROS  Past medical history, past surgical history, family history and social history were all reviewed and documented in the EPIC chart.  Exam:  Vitals:   06/17/24 1048  BP: 132/86  Pulse: 97  SpO2: 95%  Weight: 150 lb (68 kg)  Height: 5' 4.75 (1.645 m)   Body mass index is 25.15 kg/m.  Physical Exam Vitals and nursing note reviewed.  Constitutional:      Appearance: Normal appearance. She is normal weight.  Pulmonary:     Effort: Pulmonary effort is normal.  Neurological:     Mental Status: She is alert.  Psychiatric:        Attention and Perception: Attention normal.        Mood  and Affect: Mood is anxious.        Speech: Speech normal.        Behavior: Behavior normal.        Thought Content: Thought content normal.        Cognition and Memory: Cognition normal.      Darice Hoit, CMA present for exam  Assessment/Plan:   1. Mood changes (Primary) Will r/o hormones however it does not seem to be PMDD due to length of symptoms every month. Patient is a smoker, not a good candidate for estrogen containing BC. - Estradiol  - FSH - LH - Testos,Total,Free and SHBG (Female) - Thyroid  Panel With TSH - Ambulatory referral to Psychiatry  2. Positive depression screening - Ambulatory referral to Psychiatry     GINETTE SHASTA NOVAK WHNP-BC 11:13 AM 06/17/2024

## 2024-06-22 ENCOUNTER — Ambulatory Visit: Payer: Self-pay | Admitting: Radiology

## 2024-06-22 LAB — TESTOS,TOTAL,FREE AND SHBG (FEMALE)
Free Testosterone: 2.2 pg/mL (ref 0.1–6.4)
Sex Hormone Binding: 112 nmol/L (ref 17–124)
Testosterone, Total, LC-MS-MS: 41 ng/dL (ref 2–45)

## 2024-06-22 LAB — THYROID PANEL WITH TSH
Free Thyroxine Index: 2.6 (ref 1.4–3.8)
T3 Uptake: 30 % (ref 22–35)
T4, Total: 8.8 ug/dL (ref 5.1–11.9)
TSH: 0.99 m[IU]/L

## 2024-06-22 LAB — FOLLICLE STIMULATING HORMONE: FSH: 5.7 m[IU]/mL

## 2024-06-22 LAB — LUTEINIZING HORMONE: LH: 5.7 m[IU]/mL

## 2024-06-22 LAB — ESTRADIOL: Estradiol: 87 pg/mL

## 2024-08-31 ENCOUNTER — Ambulatory Visit (HOSPITAL_COMMUNITY): Payer: Self-pay | Admitting: Clinical
